# Patient Record
Sex: Female | Born: 1995 | Race: Black or African American | Hispanic: No | Marital: Single | Smoking: Former smoker
Health system: Southern US, Community
[De-identification: ages and names within clinical notes are randomized; demographics above are authoritative.]

## PROBLEM LIST (undated history)

## (undated) DIAGNOSIS — F1910 Other psychoactive substance abuse, uncomplicated: Secondary | ICD-10-CM

---
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
## 2016-07-08 ENCOUNTER — Emergency Department (HOSPITAL_COMMUNITY)
Admission: EM | Admit: 2016-07-08 | Discharge: 2016-07-08 | Disposition: A | Discharge status: Home / Self Care | Payer: Medicaid Other | Attending: Emergency Medicine | Admitting: Emergency Medicine

## 2016-07-08 ENCOUNTER — Encounter (HOSPITAL_COMMUNITY): Payer: Self-pay

## 2016-07-08 DIAGNOSIS — J069 Acute upper respiratory infection, unspecified: Secondary | ICD-10-CM

## 2016-07-08 DIAGNOSIS — Z87891 Personal history of nicotine dependence: Secondary | ICD-10-CM | POA: Insufficient documentation

## 2016-07-08 MED ORDER — LORATADINE 10 MG PO TABS: 10.0000 mg | ORAL_TABLET | Freq: Every day | ORAL | 0 refills | Status: DC

## 2016-07-08 MED ORDER — BENZONATATE 100 MG PO CAPS: 100.0000 mg | ORAL_CAPSULE | Freq: Three times a day (TID) | ORAL | 0 refills | Status: DC

## 2016-07-08 NOTE — ED Notes (Signed)
Pt c/o cold symptoms and sore throat.  Has not taken any OTC meds.  Pt st's needs a doctors note for work today.

## 2016-07-08 NOTE — ED Triage Notes (Signed)
Onset several days sore throat, yesterday runny nose, headache, today pt woke up sore throat worse and non productive cough.  No shortness of breath, N/V/D.

## 2016-07-08 NOTE — ED Provider Notes (Signed)
[facility]-EMERGENCY DEPT Provider Note   CSN: [id number] Arrival date & time: 07/08/16  1453  By signing my name below, I, Nelwyn SalisburyJoshua Fowler, attest that this documentation has been prepared under the direction and in the presence of non-physician practitioner, Kerrie BuffaloHope Kiauna Zywicki, NP. Electronically Signed: Nelwyn SalisburyJoshua Fowler, Scribe. 07/08/2016. 4:06 PM.  History   Chief Complaint Chief Complaint  Patient presents with  . Sore Throat  . Cough   The history is provided by the patient. No language interpreter was used.  Sore Throat  This is a new problem. The current episode started more than 2 days ago. The problem occurs constantly. The problem has not changed since onset.Associated symptoms include headaches. Pertinent negatives include no abdominal pain and no shortness of breath. The symptoms are aggravated by swallowing. Nothing relieves the symptoms. She has tried nothing for the symptoms. The treatment provided no relief.    HPI Comments:  Erica Bullock is an otherwise healthy 21 y.o. female who presents to the Emergency Department complaining of constant, mild, scratchy throat onset 4 days. Her pain is worsened with swallowing. She reports associated non-productive, cough, nasal congestion, rhinorrhea, headache, sneezing, sinus pressure and itchy eyes . Pt has taken anything PTA for her symptoms. Denies any ear ache, nausea, vomiting, abdominal pain, diarrhea, wheezing, constipation or SOB. P/o intake intact.   History reviewed. No pertinent past medical history.  There are no active problems to display for this patient.   History reviewed. No pertinent surgical history.  OB History    No data available       Home Medications    Prior to Admission medications   Medication Sig Start Date End Date Taking? Authorizing Provider  benzonatate (TESSALON) 100 MG capsule Take 1 capsule (100 mg total) by mouth every 8 (eight) hours. 07/08/16   Janne NapoleonNeese, Keitra Carusone M, NP  fluticasone (FLONASE) 50 MCG/ACT nasal  spray Place 2 sprays into the nose daily. 07/17/11 09/01/12  Truddie CocoBush, Tamika, DO  loratadine (CLARITIN) 10 MG tablet Take 1 tablet (10 mg total) by mouth daily. 07/08/16   Janne NapoleonNeese, Jenasis Straley M, NP    Family History History reviewed. No pertinent family history.  Social History Social History  Substance Use Topics  . Smoking status: Former Games developermoker  . Smokeless tobacco: Never Used  . Alcohol use No     Allergies   Other   Review of Systems Review of Systems  Constitutional: Negative for chills and fever.  HENT: Positive for congestion, rhinorrhea, sinus pressure and sneezing. Negative for ear pain, trouble swallowing and voice change. Sore throat: scratchy.   Eyes: Positive for itching. Negative for pain.  Respiratory: Positive for cough. Negative for shortness of breath and wheezing.   Gastrointestinal: Negative for abdominal pain, constipation, diarrhea, nausea and vomiting.  Musculoskeletal: Negative for neck pain.  Skin: Negative for rash.  Neurological: Positive for headaches.  Hematological: Negative for adenopathy.  Psychiatric/Behavioral: The patient is not nervous/anxious.      Physical Exam Updated Vital Signs BP 107/67 (BP Location: Left Arm)   Pulse 89   Temp 99.1 F (37.3 C) (Oral)   Resp 16   Ht 5' (1.524 m)   Wt 56 kg   LMP 06/17/2016   SpO2 100%   BMI 24.10 kg/m   Physical Exam  Constitutional: She is oriented to person, place, and time. She appears well-developed and well-nourished. No distress.  HENT:  Head: Normocephalic and atraumatic.  Right Ear: Tympanic membrane normal.  Left Ear: Tympanic membrane normal.  Nose: Mucosal edema  and rhinorrhea present.  Mouth/Throat: Uvula is midline, oropharynx is clear and moist and mucous membranes are normal. Normal dentition. No posterior oropharyngeal edema or posterior oropharyngeal erythema.  Nasal mucosa swollen.   Eyes: Conjunctivae and EOM are normal. Pupils are equal, round, and reactive to light. Right eye  exhibits no discharge. Left eye exhibits no discharge. No scleral icterus.  Neck: Neck supple.  Cardiovascular: Normal rate and regular rhythm.   Pulmonary/Chest: Effort normal and breath sounds normal.  Abdominal: Soft. Bowel sounds are normal. She exhibits no distension. There is no tenderness.  Musculoskeletal: Normal range of motion.  Lymphadenopathy:    She has no cervical adenopathy.  Neurological: She is alert and oriented to person, place, and time.  Skin: Skin is warm and dry.  Psychiatric: She has a normal mood and affect. Her behavior is normal.  Nursing note and vitals reviewed.    ED Treatments / Results  DIAGNOSTIC STUDIES:  Oxygen Saturation is 100% on RA, normal by my interpretation.    COORDINATION OF CARE:  4:18 PM Discussed treatment plan with pt at bedside which includes symptomatic management and pt agreed to plan.  Labs (all labs ordered are listed, but only abnormal results are displayed) Labs Reviewed - No data to display  EKG  EKG Interpretation None       Radiology No results found.  Procedures Procedures (including critical care time)  Medications Ordered in ED Medications - No data to display   Initial Impression / Assessment and Plan / ED Course  I have reviewed the triage vital signs and the nursing notes. Pt symptoms consistent with URI. Pt will be discharged with symptomatic treatment.  Discussed return precautions.  Pt is hemodynamically stable & in NAD prior to discharge.  Final Clinical Impressions(s) / ED Diagnoses   Final diagnoses:  Viral upper respiratory tract infection    New Prescriptions New Prescriptions   BENZONATATE (TESSALON) 100 MG CAPSULE    Take 1 capsule (100 mg total) by mouth every 8 (eight) hours.   LORATADINE (CLARITIN) 10 MG TABLET    Take 1 tablet (10 mg total) by mouth daily.   I personally performed the services described in this documentation, which was scribed in my presence. The recorded  information has been reviewed and is accurate.     Kerrie Buffalo [place], [place] 07/08/16 1631    Mancel Bale, MD 07/11/16 [phone]

## 2017-02-28 ENCOUNTER — Emergency Department (HOSPITAL_COMMUNITY)
Admission: EM | Admit: 2017-02-28 | Discharge: 2017-02-28 | Disposition: A | Discharge status: Home / Self Care | Payer: Self-pay | Attending: Emergency Medicine | Admitting: Emergency Medicine

## 2017-02-28 ENCOUNTER — Encounter (HOSPITAL_COMMUNITY): Payer: Self-pay

## 2017-02-28 DIAGNOSIS — Z79899 Other long term (current) drug therapy: Secondary | ICD-10-CM | POA: Insufficient documentation

## 2017-02-28 DIAGNOSIS — Z87891 Personal history of nicotine dependence: Secondary | ICD-10-CM | POA: Insufficient documentation

## 2017-02-28 DIAGNOSIS — R21 Rash and other nonspecific skin eruption: Secondary | ICD-10-CM | POA: Insufficient documentation

## 2017-02-28 DIAGNOSIS — W57XXXA Bitten or stung by nonvenomous insect and other nonvenomous arthropods, initial encounter: Secondary | ICD-10-CM | POA: Insufficient documentation

## 2017-02-28 MED ORDER — CETIRIZINE HCL 10 MG PO TABS: 10.0000 mg | ORAL_TABLET | Freq: Every day | ORAL | 0 refills | Status: DC

## 2017-02-28 MED ORDER — TRIAMCINOLONE ACETONIDE 0.1 % EX CREA: 1.0000 "application " | TOPICAL_CREAM | Freq: Two times a day (BID) | CUTANEOUS | 0 refills | Status: DC

## 2017-02-28 NOTE — ED Provider Notes (Signed)
[facility] EMERGENCY DEPARTMENT Provider Note   CSN: [id number] Arrival date & time: 02/28/17  1652     History   Chief Complaint No chief complaint on file.   HPI Erica CobbOceanna Bullock is a 21 y.o. female presenting with bug bites.  Patient states she was sleeping at her cousin's house on Friday when she noticed a bite on her right hand.  The next day, she changed from the couch to her cousin's bed, and woke up with multiple bites along her back.  The bites are itchy, worse with scratching and heat.  She states it feels different than when she has had flea bites.  She denies fevers or chills.  Bites are on her torso and arms, no bites on her legs or head.  She denies new detergents, soaps, clothes, foods. She has not taken any medications for the itching.  She has been using alcohol and soap and water for symptom relief. She denies other medical problems, does not take medications daily.   HPI  History reviewed. No pertinent past medical history.  There are no active problems to display for this patient.   History reviewed. No pertinent surgical history.  OB History    No data available       Home Medications    Prior to Admission medications   Medication Sig Start Date End Date Taking? Authorizing Provider  benzonatate (TESSALON) 100 MG capsule Take 1 capsule (100 mg total) by mouth every 8 (eight) hours. 07/08/16   Janne NapoleonNeese, Hope M, NP  cetirizine (ZYRTEC) 10 MG tablet Take 1 tablet (10 mg total) by mouth daily. 02/28/17   Katrenia Alkins, PA-C  fluticasone (FLONASE) 50 MCG/ACT nasal spray Place 2 sprays into the nose daily. 07/17/11 09/01/12  Truddie CocoBush, Tamika, DO  loratadine (CLARITIN) 10 MG tablet Take 1 tablet (10 mg total) by mouth daily. 07/08/16   Janne NapoleonNeese, Hope M, NP  triamcinolone cream (KENALOG) 0.1 % Apply 1 application topically 2 (two) times daily. 02/28/17   Santanna Whitford, PA-C    Family History History reviewed. No pertinent family history.  Social  History Social History   Tobacco Use  . Smoking status: Former Games developermoker  . Smokeless tobacco: Never Used  Substance Use Topics  . Alcohol use: No  . Drug use: No     Allergies   Other   Review of Systems Review of Systems  Constitutional: Negative for chills and fever.  Skin: Positive for rash (bug bites).     Physical Exam Updated Vital Signs BP 105/64 (BP Location: Right Arm)   Pulse 74   Temp 99.1 F (37.3 C) (Oral)   Resp 16   Ht 5' (1.524 m)   Wt 63.5 kg (140 lb)   LMP 02/12/2017 (Exact Date)   SpO2 100%   BMI 27.34 kg/m   Physical Exam  Constitutional: She is oriented to person, place, and time. She appears well-developed and well-nourished. No distress.  HENT:  Head: Normocephalic and atraumatic.  Eyes: EOM are normal.  Neck: Normal range of motion.  Pulmonary/Chest: Effort normal.  Abdominal: She exhibits no distension.  Musculoskeletal: Normal range of motion.  Neurological: She is alert and oriented to person, place, and time.  Skin: Skin is warm.  Multiple discrete bites noted along bilateral posterior arms, abdomen, and back.  Bites are often linear.  Minimal excoriation.  No surrounding erythema or signs of cellulitis.  Psychiatric: She has a normal mood and affect.  Nursing note and vitals reviewed.  ED Treatments / Results  Labs (all labs ordered are listed, but only abnormal results are displayed) Labs Reviewed - No data to display  EKG  EKG Interpretation None       Radiology No results found.  Procedures Procedures (including critical care time)  Medications Ordered in ED Medications - No data to display   Initial Impression / Assessment and Plan / ED Course  I have reviewed the triage vital signs and the nursing notes.  Pertinent labs & imaging results that were available during my care of the patient were reviewed by me and considered in my medical decision making (see chart for details).     Pt presenting for  evaluation of bug bites.  Physical exam shows multiple discrete bites without significant excoriation or signs of cellulitis.  Likely bedbug bites or other insect bites.  Discussed findings with patient.  Discussed symptomatic treatment with triamcinolone and Zyrtec.  Patient to wash her close and check for bedbugs.  Patient states she has had a bedbug infestation before, and will go through the same step she did last time.  Patient to follow-up with primary care if symptoms persist.  At this time, patient appears safe for discharge.  Return precautions given.  Patient states she understands and agrees to plan.   Final Clinical Impressions(s) / ED Diagnoses   Final diagnoses:  Bug bite, initial encounter    ED Discharge Orders        Ordered    triamcinolone cream (KENALOG) 0.1 %  2 times daily     02/28/17 1921    cetirizine (ZYRTEC) 10 MG tablet  Daily     02/28/17 1921       Alveria ApleyCaccavale, Pookela Sellin, PA-C 03/01/17 0039    Jacalyn LefevreHaviland, Julie, MD 03/01/17 1515

## 2017-02-28 NOTE — ED Triage Notes (Signed)
Pt presents with red raised areas x 7 days.  Pt was at family's house, dogs had fleas;  Area began to R hand and is now generalized.  Area to hand began as pain with swelling but now is resolved.  Other areas are red and itch.

## 2017-02-28 NOTE — Discharge Instructions (Signed)
Use the triamcinolone cream as needed for itching.  Do not apply to the face. Take cetirizine daily to help with itching. Try your best not to itch the bites, as this will cause worsening symptoms.  Heat or hot water may also make it worse. Follow-up with your primary care doctor if your symptoms are not improving. Return to the emergency room if you develop fevers, drainage from the bites, or any new or concerning symptoms.

## 2017-12-02 ENCOUNTER — Ambulatory Visit (HOSPITAL_COMMUNITY)
Admission: EM | Admit: 2017-12-02 | Discharge: 2017-12-02 | Disposition: A | Discharge status: Home / Self Care | Payer: Medicaid Other | Attending: Family Medicine | Admitting: Family Medicine

## 2017-12-02 ENCOUNTER — Encounter (HOSPITAL_COMMUNITY): Payer: Self-pay | Admitting: Emergency Medicine

## 2017-12-02 DIAGNOSIS — K219 Gastro-esophageal reflux disease without esophagitis: Secondary | ICD-10-CM | POA: Insufficient documentation

## 2017-12-02 DIAGNOSIS — Z87891 Personal history of nicotine dependence: Secondary | ICD-10-CM | POA: Insufficient documentation

## 2017-12-02 DIAGNOSIS — Z3202 Encounter for pregnancy test, result negative: Secondary | ICD-10-CM

## 2017-12-02 DIAGNOSIS — R1013 Epigastric pain: Secondary | ICD-10-CM

## 2017-12-02 LAB — CBC
HEMATOCRIT: 42.2 % (ref 36.0–46.0)
HEMOGLOBIN: 13.7 g/dL (ref 12.0–15.0)
MCH: 29.2 pg (ref 26.0–34.0)
MCHC: 32.5 g/dL (ref 30.0–36.0)
MCV: 90 fL (ref 78.0–100.0)
PLATELETS: 284 10*3/uL (ref 150–400)
RBC: 4.69 MIL/uL (ref 3.87–5.11)
RDW: 12.6 % (ref 11.5–15.5)
WBC: 6.2 10*3/uL (ref 4.0–10.5)

## 2017-12-02 LAB — COMPREHENSIVE METABOLIC PANEL
ALK PHOS: 50 U/L (ref 38–126)
ALT: 15 U/L (ref 0–44)
AST: 30 U/L (ref 15–41)
Albumin: 4.5 g/dL (ref 3.5–5.0)
Anion gap: 12 (ref 5–15)
BILIRUBIN TOTAL: 0.9 mg/dL (ref 0.3–1.2)
BUN: 12 mg/dL (ref 6–20)
CALCIUM: 9.3 mg/dL (ref 8.9–10.3)
CO2: 25 mmol/L (ref 22–32)
Chloride: 99 mmol/L (ref 98–111)
Creatinine, Ser: 0.82 mg/dL (ref 0.44–1.00)
Glucose, Bld: 104 mg/dL — ABNORMAL HIGH (ref 70–99)
Potassium: 4.5 mmol/L (ref 3.5–5.1)
Sodium: 136 mmol/L (ref 135–145)
TOTAL PROTEIN: 7.3 g/dL (ref 6.5–8.1)

## 2017-12-02 LAB — LIPASE, BLOOD: LIPASE: 28 U/L (ref 11–51)

## 2017-12-02 LAB — AMYLASE: AMYLASE: 83 U/L (ref 28–100)

## 2017-12-02 LAB — POCT PREGNANCY, URINE: Preg Test, Ur: NEGATIVE

## 2017-12-02 MED ORDER — SUCRALFATE 1 G PO TABS: 1.0000 g | ORAL_TABLET | Freq: Three times a day (TID) | ORAL | 0 refills | Status: DC

## 2017-12-02 MED ORDER — RANITIDINE HCL 150 MG PO CAPS: 150.0000 mg | ORAL_CAPSULE | Freq: Two times a day (BID) | ORAL | 0 refills | Status: DC

## 2017-12-02 NOTE — ED Provider Notes (Signed)
[facility] CARE CENTER   [id number] 12/02/17 Arrival Time: 4098  ASSESSMENT & PLAN:  1. Epigastric pain     Meds ordered this encounter  Medications  . ranitidine (ZANTAC) 150 MG capsule    Sig: Take 1 capsule (150 mg total) by mouth 2 (two) times daily.    Dispense:  30 capsule    Refill:  0  . sucralfate (CARAFATE) 1 g tablet    Sig: Take 1 tablet (1 g total) by mouth 4 (four) times daily -  with meals and at bedtime.    Dispense:  28 tablet    Refill:  0     Discharge Instructions     You have been seen today for abdominal pain. Your evaluation was not suggestive of any emergent condition requiring medical intervention at this time. However, some abdominal problems make take more time to appear. Therefore, it is important for you to watch for any new symptoms or worsening of your current condition. Please return here or to the Emergency Department immediately should you feel worse in any way or have any of the following symptoms: increasing or different abdominal pain, persistent vomiting, fevers, or shaking chills.   Follow-up Information    Leilani Able, MD.   Specialty:  Family Medicine Why:  If your abdominal pain is not getting better with the medicines prescribed. Contact information: [address] [place] [address] [phone]        [facility] EMERGENCY DEPARTMENT.   Specialty:  Emergency Medicine Why:  If symptoms worsen. Contact information: [address] [id number] mc [place] [address] [phone]          Reviewed expectations re: course of current medical issues. Questions answered. Outlined signs and symptoms indicating need for more acute intervention. Patient verbalized understanding. After Visit Summary given.   SUBJECTIVE:  Brunilda Eble is a 22 y.o. female who presents with complaint of intermittent abdominal discomfort. Onset gradual, over the past month. Location: epigastric  without radiation. Described as deep and aching with sharp exacerbations. Experiences this about 3-4 times per week with each episode lasting around 30 minutes with gradual resolutions. Symptoms are unchanged since beginning. Aggravating factors: none. Alleviating factors: none. Associated symptoms: belching. She denies fever, myalgias and vomiting. Occasional mild nausea. Appetite: normal. PO intake: normal. Ambulatory without assistance. Urinary symptoms: none. No vaginal discharge. Last bowel movement yesterday without blood. No heavy alcohol or drug use except for occasional smoking of marijuana. Occasional cigarette. OTC treatment: none reported.  Patient's last menstrual period was 11/28/2017.   History reviewed. No pertinent surgical history.  ROS: As per HPI. All other systems negative.  OBJECTIVE:  Vitals:   12/02/17 1044  BP: 96/82  Pulse: 80  Resp: 16  Temp: 98.6 F (37 C)  SpO2: 100%    General appearance: alert; no distress Lungs: clear to auscultation bilaterally Heart: regular rate and rhythm Abdomen: soft; non-distended; mild epigastric tenderness; bowel sounds present; no masses or organomegaly; no guarding or rebound tenderness Back: no CVA tenderness; FROM at hips Extremities: no edema; symmetrical with no gross deformities Skin: warm and dry Neurologic: normal gait Psychological: alert and cooperative; normal mood and affect  Labs: Pending:  Labs Reviewed  CBC  COMPREHENSIVE METABOLIC PANEL  AMYLASE  LIPASE, BLOOD  POCT PREGNANCY, URINE  UPT negative.  Allergies  Allergen Reactions  . Other     Horse serum    PMH: Occasional acid reflux.  Social History   Socioeconomic History  . Marital status: Single    Spouse name: Not on file  . Number of children: Not on file  . Years of education: Not on file  . Highest education level: Not on file  Occupational History  . Not on file  Social Needs  . Financial  resource strain: Not on file  . Food insecurity:    Worry: Not on file    Inability: Not on file  . Transportation needs:    Medical: Not on file    Non-medical: Not on file  Tobacco Use  . Smoking status: Former Games developer  . Smokeless tobacco: Never Used  Substance and Sexual Activity  . Alcohol use: No  . Drug use: No  . Sexual activity: Not on file  Lifestyle  . Physical activity:    Days per week: Not on file    Minutes per session: Not on file  . Stress: Not on file  Relationships  . Social connections:    Talks on phone: Not on file    Gets together: Not on file    Attends religious service: Not on file    Active member of club or organization: Not on file    Attends meetings of clubs or organizations: Not on file    Relationship status: Not on file  . Intimate partner violence:    Fear of current or ex partner: Not on file    Emotionally abused: Not on file    Physically abused: Not on file    Forced sexual activity: Not on file  Other Topics Concern  . Not on file  Social History Narrative  . Not on file   FH: No h/o colon disease.   Mardella Layman, MD 12/02/17 1213

## 2017-12-02 NOTE — ED Triage Notes (Signed)
Pt c/o upper middle abdominal pain x1 month, comes and goes, off and on. Pt states shes been trying gas pills because normally its gas but no relief.

## 2017-12-02 NOTE — Discharge Instructions (Addendum)
You have been seen today for abdominal pain. Your evaluation was not suggestive of any emergent condition requiring medical intervention at this time. However, some abdominal problems make take more time to appear. Therefore, it is important for you to watch for any new symptoms or worsening of your current condition. Please return here or to the Emergency Department immediately should you feel worse in any way or have any of the following symptoms: increasing or different abdominal pain, persistent vomiting, fevers, or shaking chills.  ° ° °

## 2019-01-06 ENCOUNTER — Ambulatory Visit (HOSPITAL_COMMUNITY)
Admission: EM | Admit: 2019-01-06 | Discharge: 2019-01-06 | Disposition: A | Discharge status: Home / Self Care | Payer: PRIVATE HEALTH INSURANCE | Attending: Internal Medicine | Admitting: Internal Medicine

## 2019-01-06 ENCOUNTER — Other Ambulatory Visit: Payer: Self-pay

## 2019-01-06 ENCOUNTER — Encounter (HOSPITAL_COMMUNITY): Payer: Self-pay | Admitting: Emergency Medicine

## 2019-01-06 DIAGNOSIS — N644 Mastodynia: Secondary | ICD-10-CM | POA: Diagnosis not present

## 2019-01-06 DIAGNOSIS — R0989 Other specified symptoms and signs involving the circulatory and respiratory systems: Secondary | ICD-10-CM | POA: Diagnosis present

## 2019-01-06 DIAGNOSIS — N6452 Nipple discharge: Secondary | ICD-10-CM | POA: Insufficient documentation

## 2019-01-06 DIAGNOSIS — J3089 Other allergic rhinitis: Secondary | ICD-10-CM | POA: Diagnosis not present

## 2019-01-06 DIAGNOSIS — F17210 Nicotine dependence, cigarettes, uncomplicated: Secondary | ICD-10-CM | POA: Diagnosis not present

## 2019-01-06 DIAGNOSIS — Z20828 Contact with and (suspected) exposure to other viral communicable diseases: Secondary | ICD-10-CM | POA: Insufficient documentation

## 2019-01-06 DIAGNOSIS — G44209 Tension-type headache, unspecified, not intractable: Secondary | ICD-10-CM | POA: Insufficient documentation

## 2019-01-06 MED ORDER — NAPROXEN 500 MG PO TABS: 500.0000 mg | ORAL_TABLET | Freq: Two times a day (BID) | ORAL | 0 refills | Status: DC

## 2019-01-06 MED ORDER — CETIRIZINE HCL 10 MG PO TABS: 10.0000 mg | ORAL_TABLET | Freq: Every day | ORAL | 0 refills | Status: DC

## 2019-01-06 MED ORDER — PSEUDOEPHEDRINE HCL 60 MG PO TABS: 60.0000 mg | ORAL_TABLET | Freq: Three times a day (TID) | ORAL | 0 refills | Status: DC | PRN

## 2019-01-06 MED ORDER — FLUTICASONE PROPIONATE 50 MCG/ACT NA SUSP: 2.0000 | Freq: Every day | NASAL | 0 refills | Status: DC

## 2019-01-06 NOTE — Discharge Instructions (Addendum)
Please make sure you start the allergy medications Zyrtec, Flonase.  The generic names for these are cetirizine and fluticasone.  You can pick these up over-the-counter if your insurance does not cover them.  I also want you to use pseudoephedrine at 60 mg 3 times a day as needed for any kind of postnasal drainage, sinus congestion.  This is also an over-the-counter medication that I prescribed you but you have to make sure that you specifically requests this at the pharmacy.  Ou may use naproxen for your general headaches and breast pain.  Have provided you with an order to the breast center so that you can have imaging done to further investigate your nipple discharge.

## 2019-01-06 NOTE — ED Provider Notes (Signed)
MRN: [id number] DOB: 1995-06-23  Subjective:   Erica Bullock is a 23 y.o. female presenting for 1 week history of persistent mild-severe waxing and waning frontal-temporal headache.  Has tried some ibuprofen intermittently with temporary relief.  Has also had left breast pain. No skin changes of her breast, spontaneous drainage of pus or bleeding. However, admits that she has been able to express her right breast over the past 2 years.  Does not have a PCP, did set up office visit but wanted to have a consult regarding her headache.  No known COVID-19 contacts.  Tries to hydrate well.  She gets about 8 hours of sleep nightly.  She does smoke cigarettes about a 1/4-1/2ppd but has been trying to quit.  Denies taking any medications currently.  Allergies  Allergen Reactions  . Other     Horse serum     History reviewed. No pertinent past medical history.   History reviewed. No pertinent surgical history.  Review of Systems  Constitutional: Negative for fever and malaise/fatigue.  HENT: Negative for congestion, ear pain, sinus pain and sore throat.        Intermittent runny nose.   Eyes: Negative for discharge and redness.  Respiratory: Positive for cough (dry, was happening before her headache symptoms, comes and goes). Negative for hemoptysis, shortness of breath and wheezing.   Cardiovascular: Negative for chest pain.  Gastrointestinal: Negative for abdominal pain, blood in stool, constipation, diarrhea, nausea and vomiting.  Genitourinary: Negative for dysuria, flank pain and hematuria.  Musculoskeletal: Negative for myalgias.  Skin: Negative for rash.  Neurological: Positive for headaches. Negative for dizziness and weakness.  Psychiatric/Behavioral: Negative for depression and substance abuse.    Objective:   Vitals: BP 119/77 (BP Location: Right Arm)   Pulse 84   Temp 98.3 F (36.8 C) (Oral)   Resp 15   LMP 12/25/2018   SpO2 100%   Physical Exam Constitutional:    General: She is not in acute distress.    Appearance: Normal appearance. She is well-developed. She is not ill-appearing, toxic-appearing or diaphoretic.  HENT:     Head: Normocephalic and atraumatic.     Right Ear: Tympanic membrane and ear canal normal. No drainage or tenderness. No middle ear effusion. Tympanic membrane is not erythematous.     Left Ear: Tympanic membrane and ear canal normal. No drainage or tenderness.  No middle ear effusion. Tympanic membrane is not erythematous.     Nose: Rhinorrhea present. No congestion.     Comments: Nasal turbinates erythematous.    Mouth/Throat:     Mouth: Mucous membranes are moist. No oral lesions.     Pharynx: No pharyngeal swelling, oropharyngeal exudate, posterior oropharyngeal erythema or uvula swelling.     Tonsils: No tonsillar exudate or tonsillar abscesses.     Comments: Thick streaks of postnasal drainage. Eyes:     General: No scleral icterus.    Extraocular Movements: Extraocular movements intact.     Right eye: Normal extraocular motion.     Left eye: Normal extraocular motion.     Conjunctiva/sclera: Conjunctivae normal.     Pupils: Pupils are equal, round, and reactive to light.  Neck:     Musculoskeletal: Normal range of motion and neck supple.  Cardiovascular:     Rate and Rhythm: Normal rate and regular rhythm.     Pulses: Normal pulses.     Heart sounds: Normal heart sounds. No murmur. No friction rub. No gallop.   Pulmonary:     Effort:  Pulmonary effort is normal. No respiratory distress.     Breath sounds: Normal breath sounds. No stridor. No wheezing, rhonchi or rales.  Lymphadenopathy:     Cervical: No cervical adenopathy.  Skin:    General: Skin is warm and dry.     Findings: No rash.  Neurological:     General: No focal deficit present.     Mental Status: She is alert and oriented to person, place, and time.     Cranial Nerves: No cranial nerve deficit.     Motor: No weakness.     Coordination: Coordination  normal.     Gait: Gait normal.     Deep Tendon Reflexes: Reflexes normal.  Psychiatric:        Mood and Affect: Mood normal.        Behavior: Behavior normal.        Thought Content: Thought content normal.        Judgment: Judgment normal.    Assessment and Plan :   1. Tension headache   2. Allergic rhinitis due to other allergic trigger, unspecified seasonality   3. Runny nose   4. Breast pain, left   5. Nipple discharge     Suspect tension type headache.  Will use naproxen for symptomatic relief and start her on allergy regimen given her ENT findings.  We will have patient start imaging at the breast center, order given to patient.  Emphasized need to maintain her appointment to establish care with her PCP for follow-up. Counseled patient on potential for adverse effects with medications prescribed/recommended today, ER and return-to-clinic precautions discussed, patient verbalized understanding.    Wallis Bamberg, [place] 01/06/19 1636

## 2019-01-06 NOTE — ED Triage Notes (Signed)
Headache since last week.  Left epigastric pain.  Denies nausea, no vomiting. No fever, no cough

## 2019-01-07 LAB — NOVEL CORONAVIRUS, NAA (HOSP ORDER, SEND-OUT TO REF LAB; TAT 18-24 HRS): SARS-CoV-2, NAA: NOT DETECTED

## 2019-02-04 ENCOUNTER — Telehealth: Payer: Self-pay | Admitting: *Deleted

## 2019-02-04 NOTE — Telephone Encounter (Signed)
Patient called for results , also signed up for my chart .

## 2019-05-31 ENCOUNTER — Encounter (HOSPITAL_COMMUNITY): Payer: Self-pay

## 2019-05-31 ENCOUNTER — Emergency Department (HOSPITAL_COMMUNITY)
Admission: EM | Admit: 2019-05-31 | Discharge: 2019-05-31 | Disposition: A | Discharge status: Home / Self Care | Payer: PRIVATE HEALTH INSURANCE | Attending: Emergency Medicine | Admitting: Emergency Medicine

## 2019-05-31 DIAGNOSIS — R1031 Right lower quadrant pain: Secondary | ICD-10-CM | POA: Insufficient documentation

## 2019-05-31 DIAGNOSIS — Z5321 Procedure and treatment not carried out due to patient leaving prior to being seen by health care provider: Secondary | ICD-10-CM | POA: Insufficient documentation

## 2019-05-31 DIAGNOSIS — R197 Diarrhea, unspecified: Secondary | ICD-10-CM | POA: Insufficient documentation

## 2019-05-31 LAB — COMPREHENSIVE METABOLIC PANEL
ALT: 12 U/L (ref 0–44)
AST: 21 U/L (ref 15–41)
Albumin: 4.3 g/dL (ref 3.5–5.0)
Alkaline Phosphatase: 59 U/L (ref 38–126)
Anion gap: 9 (ref 5–15)
BUN: 10 mg/dL (ref 6–20)
CO2: 25 mmol/L (ref 22–32)
Calcium: 9.7 mg/dL (ref 8.9–10.3)
Chloride: 103 mmol/L (ref 98–111)
Creatinine, Ser: 0.87 mg/dL (ref 0.44–1.00)
GFR calc Af Amer: >60 mL/min (ref >60)
GFR calc non Af Amer: >60 mL/min (ref >60)
Glucose, Bld: 101 mg/dL — ABNORMAL HIGH (ref 70–99)
Potassium: 3.6 mmol/L (ref 3.5–5.1)
Sodium: 137 mmol/L (ref 135–145)
Total Bilirubin: 0.5 mg/dL (ref 0.3–1.2)
Total Protein: 7 g/dL (ref 6.5–8.1)

## 2019-05-31 LAB — URINALYSIS, ROUTINE W REFLEX MICROSCOPIC
Bilirubin Urine: NEGATIVE
Glucose, UA: NEGATIVE mg/dL
Ketones, ur: NEGATIVE mg/dL
Nitrite: NEGATIVE
Protein, ur: 30 mg/dL — AB
Specific Gravity, Urine: 1.004 — ABNORMAL LOW (ref 1.005–1.030)
WBC, UA: >50 WBC/hpf — ABNORMAL HIGH (ref 0–5)
pH: 6 (ref 5.0–8.0)

## 2019-05-31 LAB — I-STAT BETA HCG BLOOD, ED (MC, WL, AP ONLY): I-stat hCG, quantitative: <5 m[IU]/mL (ref <5)

## 2019-05-31 LAB — CBC
HCT: 38.7 % (ref 36.0–46.0)
Hemoglobin: 12.6 g/dL (ref 12.0–15.0)
MCH: 28.3 pg (ref 26.0–34.0)
MCHC: 32.6 g/dL (ref 30.0–36.0)
MCV: 87 fL (ref 80.0–100.0)
Platelets: 289 10*3/uL (ref 150–400)
RBC: 4.45 MIL/uL (ref 3.87–5.11)
RDW: 12.9 % (ref 11.5–15.5)
WBC: 9.8 10*3/uL (ref 4.0–10.5)
nRBC: 0 % (ref 0.0–0.2)

## 2019-05-31 LAB — LIPASE, BLOOD: Lipase: 20 U/L (ref 11–51)

## 2019-05-31 MED ORDER — SODIUM CHLORIDE 0.9% FLUSH: 3.0000 mL | Freq: Once | INTRAVENOUS | Status: DC

## 2019-05-31 NOTE — ED Notes (Signed)
Pt states she is going to leave, pt aware that she is able to come back at a later time if needed.

## 2019-05-31 NOTE — ED Triage Notes (Signed)
Pt arrives to the ED w/ c/o intermittent 7/10 RLQ abdominal pain x 1 day. Pt denies n/v, endorses diarrhea. Pt states she is concerned she has a UTI.

## 2020-04-10 ENCOUNTER — Ambulatory Visit (HOSPITAL_COMMUNITY)
Admission: EM | Admit: 2020-04-10 | Discharge: 2020-04-10 | Disposition: A | Discharge status: Home / Self Care | Payer: Medicaid Other | Attending: Emergency Medicine | Admitting: Emergency Medicine

## 2020-04-10 ENCOUNTER — Encounter (HOSPITAL_COMMUNITY): Payer: Self-pay | Admitting: Emergency Medicine

## 2020-04-10 ENCOUNTER — Other Ambulatory Visit: Payer: Self-pay

## 2020-04-10 DIAGNOSIS — K219 Gastro-esophageal reflux disease without esophagitis: Secondary | ICD-10-CM

## 2020-04-10 MED ORDER — OMEPRAZOLE 20 MG PO CPDR: 20.0000 mg | DELAYED_RELEASE_CAPSULE | Freq: Every day | ORAL | 0 refills | Status: DC

## 2020-04-10 MED ORDER — FAMOTIDINE 20 MG PO TABS: 20.0000 mg | ORAL_TABLET | Freq: Every day | ORAL | 0 refills | Status: DC

## 2020-04-10 NOTE — ED Triage Notes (Signed)
Pt states that she is having upper abdominal pain that started a few months ago but recently the pain has gotten worse.

## 2020-04-10 NOTE — Discharge Instructions (Signed)
Omeprazole daily. Pepcid at night as needed.  See food choices which should help your symptoms.  Avoid alcohol, ibuprofen/aleve/ aspirin, and smoking, as able to prevent stomach ulcers.

## 2020-04-10 NOTE — ED Provider Notes (Signed)
[facility]    CSN: [id number] Arrival date & time: 04/10/20  1353      History   Chief Complaint Chief Complaint  Patient presents with  . Abdominal Pain    HPI Erica Bullock is a 25 y.o. female.   Erica Bullock presents with complaints of epigastric burning, which wakes her at night, and is worse after eating some foods, particularly spicy foods. Taking generic tums does help. No current pain as she took tums PTA. Has had issues like this for months now. No nausea or vomiting. She does smoke. No blood or black to stool.     ROS per HPI, negative if not otherwise mentioned.      History reviewed. No pertinent past medical history.  There are no problems to display for this patient.   History reviewed. No pertinent surgical history.  OB History   No obstetric history on file.      Home Medications    Prior to Admission medications   Medication Sig Start Date End Date Taking? Authorizing Provider  famotidine (PEPCID) 20 MG tablet Take 1 tablet (20 mg total) by mouth at bedtime. 04/10/20  Yes Linus Mako B, NP  omeprazole (PRILOSEC) 20 MG capsule Take 1 capsule (20 mg total) by mouth daily. 04/10/20  Yes Linus Mako B, NP  cetirizine (ZYRTEC) 10 MG tablet Take 1 tablet (10 mg total) by mouth daily. 01/06/19   Wallis Bamberg, PA-C  fluticasone (FLONASE) 50 MCG/ACT nasal spray Place 2 sprays into both nostrils daily. 01/06/19   Wallis Bamberg, PA-C  naproxen (NAPROSYN) 500 MG tablet Take 1 tablet (500 mg total) by mouth 2 (two) times daily. 01/06/19   Wallis Bamberg, PA-C  pseudoephedrine (SUDAFED) 60 MG tablet Take 1 tablet (60 mg total) by mouth every 8 (eight) hours as needed for congestion. 01/06/19   Wallis Bamberg, PA-C  loratadine (CLARITIN) 10 MG tablet Take 1 tablet (10 mg total) by mouth daily. 07/08/16 01/06/19  Janne Napoleon, NP  ranitidine (ZANTAC) 150 MG capsule Take 1 capsule (150 mg total) by mouth 2 (two) times daily. 12/02/17 01/06/19  Mardella Layman, MD   sucralfate (CARAFATE) 1 g tablet Take 1 tablet (1 g total) by mouth 4 (four) times daily -  with meals and at bedtime. 12/02/17 01/06/19  Mardella Layman, MD    Family History History reviewed. No pertinent family history.  Social History Social History   Tobacco Use  . Smoking status: Current Every Day Smoker  . Smokeless tobacco: Never Used  Substance Use Topics  . Alcohol use: No  . Drug use: Yes    Types: Marijuana     Allergies   Other and Penicillins   Review of Systems Review of Systems   Physical Exam Triage Vital Signs ED Triage Vitals  Enc Vitals Group     BP 04/10/20 1421 122/70     Pulse Rate 04/10/20 1421 91     Resp 04/10/20 1421 18     Temp 04/10/20 1421 98.4 F (36.9 C)     Temp Source 04/10/20 1421 Oral     SpO2 04/10/20 1421 100 %     Weight --      Height --      Head Circumference --      Peak Flow --      Pain Score 04/10/20 1418 0     Pain Loc --      Pain Edu? --      Excl. in GC? --  No data found.  Updated Vital Signs BP 122/70 (BP Location: Left Arm)   Pulse 91   Temp 98.4 F (36.9 C) (Oral)   Resp 18   LMP 03/28/2020 (Approximate)   SpO2 100%    Physical Exam Constitutional:      General: She is not in acute distress.    Appearance: She is well-developed.  Cardiovascular:     Rate and Rhythm: Normal rate.  Pulmonary:     Effort: Pulmonary effort is normal.  Abdominal:     Tenderness: There is abdominal tenderness in the epigastric area.     Comments: Very mild epigastric pain on palpation   Skin:    General: Skin is warm and dry.  Neurological:     Mental Status: She is alert and oriented to person, place, and time.      UC Treatments / Results  Labs (all labs ordered are listed, but only abnormal results are displayed) Labs Reviewed - No data to display  EKG   Radiology No results found.  Procedures Procedures (including critical care time)  Medications Ordered in UC Medications - No data to  display  Initial Impression / Assessment and Plan / UC Course  I have reviewed the triage vital signs and the nursing notes.  Pertinent labs & imaging results that were available during my care of the patient were reviewed by me and considered in my medical decision making (see chart for details).     Non toxic. Benign physical exam. Consistent with gerd. Omeprazole and pepcid provided. Diet recommendations provided and discussed. Patient concerned about gastric ulcer, discussed risk factors and prevention. Return precautions provided. Patient verbalized understanding and agreeable to plan.   Final Clinical Impressions(s) / UC Diagnoses   Final diagnoses:  Gastroesophageal reflux disease, unspecified whether esophagitis present     Discharge Instructions     Omeprazole daily. Pepcid at night as needed.  See food choices which should help your symptoms.  Avoid alcohol, ibuprofen/aleve/ aspirin, and smoking, as able to prevent stomach ulcers.    ED Prescriptions    Medication Sig Dispense Auth. Provider   omeprazole (PRILOSEC) 20 MG capsule Take 1 capsule (20 mg total) by mouth daily. 30 capsule Linus Mako B, NP   famotidine (PEPCID) 20 MG tablet Take 1 tablet (20 mg total) by mouth at bedtime. 30 tablet Georgetta Haber, NP     PDMP not reviewed this encounter.   Georgetta Haber, NP 04/10/20 1500

## 2021-09-15 ENCOUNTER — Ambulatory Visit
Admission: EM | Admit: 2021-09-15 | Discharge: 2021-09-15 | Disposition: A | Discharge status: Home / Self Care | Payer: Medicaid Other | Attending: Family Medicine | Admitting: Family Medicine

## 2021-09-15 ENCOUNTER — Encounter: Payer: Self-pay | Admitting: Emergency Medicine

## 2021-09-15 DIAGNOSIS — R300 Dysuria: Secondary | ICD-10-CM

## 2021-09-15 DIAGNOSIS — N898 Other specified noninflammatory disorders of vagina: Secondary | ICD-10-CM

## 2021-09-15 LAB — POCT URINALYSIS DIP (MANUAL ENTRY)
Glucose, UA: NEGATIVE mg/dL
Nitrite, UA: NEGATIVE
Protein Ur, POC: 100 mg/dL — AB
Spec Grav, UA: >=1.03 — AB (ref 1.010–1.025)
Urobilinogen, UA: 2 E.U./dL — AB
pH, UA: 5.5 (ref 5.0–8.0)

## 2021-09-15 MED ORDER — NITROFURANTOIN MONOHYD MACRO 100 MG PO CAPS: 100.0000 mg | ORAL_CAPSULE | Freq: Two times a day (BID) | ORAL | 0 refills | Status: DC

## 2021-09-15 NOTE — ED Triage Notes (Signed)
Pain on urination since yesterday. 

## 2021-09-15 NOTE — ED Provider Notes (Signed)
[facility]    CSN: [id number] Arrival date & time: 09/15/21  1319      History   Chief Complaint No chief complaint on file.   HPI Erica Bullock is a 26 y.o. female.   Presenting today with 1 day history of dysuria, suprapubic pressure, vaginal odor.  Denies vaginal discharge, lesions, rashes, nausea, vomiting, fevers, back pain.  States there was a bit of blood when wiping this morning but she is also coming off her period so she was uncertain which that was coming from.  Not trying anything over-the-counter for symptoms.    History reviewed. No pertinent past medical history.  There are no problems to display for this patient.   History reviewed. No pertinent surgical history.  OB History   No obstetric history on file.      Home Medications    Prior to Admission medications   Medication Sig Start Date End Date Taking? Authorizing Provider  nitrofurantoin, macrocrystal-monohydrate, (MACROBID) 100 MG capsule Take 1 capsule (100 mg total) by mouth 2 (two) times daily. 09/15/21  Yes Particia Nearing, PA-C  cetirizine (ZYRTEC) 10 MG tablet Take 1 tablet (10 mg total) by mouth daily. 01/06/19   Wallis Bamberg, PA-C  famotidine (PEPCID) 20 MG tablet Take 1 tablet (20 mg total) by mouth at bedtime. 04/10/20   Georgetta Haber, NP  fluticasone (FLONASE) 50 MCG/ACT nasal spray Place 2 sprays into both nostrils daily. 01/06/19   Wallis Bamberg, PA-C  naproxen (NAPROSYN) 500 MG tablet Take 1 tablet (500 mg total) by mouth 2 (two) times daily. 01/06/19   Wallis Bamberg, PA-C  omeprazole (PRILOSEC) 20 MG capsule Take 1 capsule (20 mg total) by mouth daily. 04/10/20   Georgetta Haber, NP  pseudoephedrine (SUDAFED) 60 MG tablet Take 1 tablet (60 mg total) by mouth every 8 (eight) hours as needed for congestion. 01/06/19   Wallis Bamberg, PA-C  loratadine (CLARITIN) 10 MG tablet Take 1 tablet (10 mg total) by mouth daily. 07/08/16 01/06/19  Janne Napoleon, NP  ranitidine (ZANTAC) 150 MG  capsule Take 1 capsule (150 mg total) by mouth 2 (two) times daily. 12/02/17 01/06/19  Mardella Layman, MD  sucralfate (CARAFATE) 1 g tablet Take 1 tablet (1 g total) by mouth 4 (four) times daily -  with meals and at bedtime. 12/02/17 01/06/19  Mardella Layman, MD    Family History History reviewed. No pertinent family history.  Social History Social History   Tobacco Use   Smoking status: Former    Types: Cigarettes   Smokeless tobacco: Never  Vaping Use   Vaping Use: Every day   Substances: Nicotine  Substance Use Topics   Alcohol use: Yes   Drug use: Yes    Types: Marijuana     Allergies   Other and Penicillins   Review of Systems Review of Systems Per HPI  Physical Exam Triage Vital Signs ED Triage Vitals  Enc Vitals Group     BP 09/15/21 1343 120/72     Pulse Rate 09/15/21 1343 65     Resp 09/15/21 1343 18     Temp 09/15/21 1343 98.4 F (36.9 C)     Temp Source 09/15/21 1343 Oral     SpO2 09/15/21 1343 98 %     Weight --      Height --      Head Circumference --      Peak Flow --      Pain Score 09/15/21 1344 5  Pain Loc --      Pain Edu? --      Excl. in GC? --    No data found.  Updated Vital Signs BP 120/72 (BP Location: Right Arm)   Pulse 65   Temp 98.4 F (36.9 C) (Oral)   Resp 18   LMP 09/07/2021 (Exact Date)   SpO2 98%   Visual Acuity Right Eye Distance:   Left Eye Distance:   Bilateral Distance:    Right Eye Near:   Left Eye Near:    Bilateral Near:     Physical Exam Vitals and nursing note reviewed.  Constitutional:      Appearance: Normal appearance. She is not ill-appearing.  HENT:     Head: Atraumatic.     Mouth/Throat:     Mouth: Mucous membranes are moist.     Pharynx: Oropharynx is clear.  Eyes:     Extraocular Movements: Extraocular movements intact.     Conjunctiva/sclera: Conjunctivae normal.  Cardiovascular:     Rate and Rhythm: Normal rate and regular rhythm.     Heart sounds: Normal heart sounds.  Pulmonary:      Effort: Pulmonary effort is normal.     Breath sounds: Normal breath sounds.  Genitourinary:    Comments: GU exam deferred, self swab performed Musculoskeletal:        General: Normal range of motion.     Cervical back: Normal range of motion and neck supple.  Skin:    General: Skin is warm and dry.  Neurological:     Mental Status: She is alert and oriented to person, place, and time.     Motor: No weakness.     Gait: Gait normal.  Psychiatric:        Mood and Affect: Mood normal.        Thought Content: Thought content normal.        Judgment: Judgment normal.    UC Treatments / Results  Labs (all labs ordered are listed, but only abnormal results are displayed) Labs Reviewed  POCT URINALYSIS DIP (MANUAL ENTRY) - Abnormal; Notable for the following components:      Result Value   Bilirubin, UA small (*)    Ketones, POC UA moderate (40) (*)    Spec Grav, UA >=1.030 (*)    Blood, UA moderate (*)    Protein Ur, POC =100 (*)    Urobilinogen, UA 2.0 (*)    Leukocytes, UA Trace (*)    All other components within normal limits  URINE CULTURE  CERVICOVAGINAL ANCILLARY ONLY    EKG   Radiology No results found.  Procedures Procedures (including critical care time)  Medications Ordered in UC Medications - No data to display  Initial Impression / Assessment and Plan / UC Course  I have reviewed the triage vital signs and the nursing notes.  Pertinent labs & imaging results that were available during my care of the patient were reviewed by me and considered in my medical decision making (see chart for details).     Vitals and exam overall reassuring, urinalysis possibly showing urinary tract infection.  We will treat with Macrobid while awaiting urine culture and vaginal swab results for further rule out.  Return for worsening symptoms.  Final Clinical Impressions(s) / UC Diagnoses   Final diagnoses:  Dysuria  Vaginal odor   Discharge Instructions   None     ED Prescriptions     Medication Sig Dispense Auth. Provider   nitrofurantoin, macrocrystal-monohydrate, (MACROBID) 100  MG capsule Take 1 capsule (100 mg total) by mouth 2 (two) times daily. 10 capsule Volney American, [place]      PDMP not reviewed this encounter.   Volney American, [place] 09/15/21 1417

## 2021-09-18 LAB — URINE CULTURE: Culture: 60000 — AB

## 2021-09-20 ENCOUNTER — Telehealth (HOSPITAL_COMMUNITY): Payer: Self-pay | Admitting: Emergency Medicine

## 2021-09-20 LAB — CERVICOVAGINAL ANCILLARY ONLY
Bacterial Vaginitis (gardnerella): POSITIVE — AB
Candida Glabrata: NEGATIVE
Candida Vaginitis: NEGATIVE
Chlamydia: NEGATIVE
Comment: NEGATIVE
Comment: NEGATIVE
Comment: NEGATIVE
Comment: NEGATIVE
Comment: NEGATIVE
Comment: NORMAL
Neisseria Gonorrhea: NEGATIVE
Trichomonas: NEGATIVE

## 2021-09-20 MED ORDER — METRONIDAZOLE 500 MG PO TABS: 500.0000 mg | ORAL_TABLET | Freq: Two times a day (BID) | ORAL | 0 refills | Status: DC

## 2022-03-10 ENCOUNTER — Encounter (HOSPITAL_BASED_OUTPATIENT_CLINIC_OR_DEPARTMENT_OTHER): Payer: Self-pay

## 2022-03-10 ENCOUNTER — Other Ambulatory Visit: Payer: Self-pay

## 2022-03-10 ENCOUNTER — Emergency Department (HOSPITAL_BASED_OUTPATIENT_CLINIC_OR_DEPARTMENT_OTHER)
Admission: EM | Admit: 2022-03-10 | Discharge: 2022-03-11 | Disposition: A | Discharge status: Home / Self Care | Payer: Managed Care, Other (non HMO) | Attending: Emergency Medicine | Admitting: Emergency Medicine

## 2022-03-10 DIAGNOSIS — R1030 Lower abdominal pain, unspecified: Secondary | ICD-10-CM | POA: Diagnosis not present

## 2022-03-10 DIAGNOSIS — R1031 Right lower quadrant pain: Secondary | ICD-10-CM | POA: Diagnosis not present

## 2022-03-10 DIAGNOSIS — N3000 Acute cystitis without hematuria: Secondary | ICD-10-CM | POA: Diagnosis not present

## 2022-03-10 DIAGNOSIS — Z87891 Personal history of nicotine dependence: Secondary | ICD-10-CM | POA: Insufficient documentation

## 2022-03-10 LAB — URINALYSIS, ROUTINE W REFLEX MICROSCOPIC
Glucose, UA: NEGATIVE mg/dL
Ketones, ur: NEGATIVE mg/dL
Nitrite: POSITIVE — AB
Protein, ur: 30 mg/dL — AB
RBC / HPF: >50 RBC/hpf — ABNORMAL HIGH (ref 0–5)
Specific Gravity, Urine: 1.014 (ref 1.005–1.030)
Trans Epithel, UA: <1
WBC, UA: >50 WBC/hpf — ABNORMAL HIGH (ref 0–5)
pH: 5.5 (ref 5.0–8.0)

## 2022-03-10 LAB — PREGNANCY, URINE: Preg Test, Ur: NEGATIVE

## 2022-03-10 MED ORDER — ONDANSETRON HCL 4 MG/2ML IJ SOLN
4.0000 mg | Freq: Once | INTRAMUSCULAR | Status: AC
  Administered 2022-03-11: 4 mg via INTRAVENOUS
  Filled 2022-03-10: qty 2

## 2022-03-10 MED ORDER — HYDROMORPHONE HCL 1 MG/ML IJ SOLN
0.5000 mg | Freq: Once | INTRAMUSCULAR | Status: AC
  Administered 2022-03-11: 0.5 mg via INTRAVENOUS
  Filled 2022-03-10: qty 1

## 2022-03-10 MED ORDER — LACTATED RINGERS IV BOLUS
1000.0000 mL | Freq: Once | INTRAVENOUS | Status: AC
  Administered 2022-03-11: 1000 mL via INTRAVENOUS

## 2022-03-10 NOTE — ED Provider Notes (Signed)
[facility] EMERGENCY [facility] Emergency Department Provider Note MRN:  [id number]  Arrival date & time: 03/11/22     Chief Complaint   Abdominal Pain   History of Present Illness   Erica Bullock is a 27 y.o. year-old female with no pertinent past medical history presenting to the ED with chief complaint of abdominal pain.  Moderate to severe abdominal pain today, feels like she cannot poop.  Has felt like there is a large stool that she cannot pass over the past few hours.  Tries to eat but it comes back up, feels like she has no room.  Also has been having some burning with urination for the past few days.  Review of Systems  A thorough review of systems was obtained and all systems are negative except as noted in the HPI and PMH.   Patient's Health History   History reviewed. No pertinent past medical history.  History reviewed. No pertinent surgical history.  History reviewed. No pertinent family history.  Social History   Socioeconomic History   Marital status: Single    Spouse name: Not on file   Number of children: Not on file   Years of education: Not on file   Highest education level: Not on file  Occupational History   Not on file  Tobacco Use   Smoking status: Former    Types: Cigarettes   Smokeless tobacco: Never  Vaping Use   Vaping Use: Every day   Substances: Nicotine  Substance and Sexual Activity   Alcohol use: Yes    Comment: occasionally   Drug use: Yes    Types: Marijuana   Sexual activity: Not on file  Other Topics Concern   Not on file  Social History Narrative   Not on file   Social Determinants of Health   Financial Resource Strain: Not on file  Food Insecurity: Not on file  Transportation Needs: Not on file  Physical Activity: Not on file  Stress: Not on file  Social Connections: Not on file  Intimate Partner Violence: Not on file     Physical Exam   Vitals:   03/11/22 0304 03/11/22 0657  BP: 116/67 105/68  Pulse: 84 74   Resp: 16 17  Temp: 98.5 F (36.9 C)   SpO2: 99% 99%    CONSTITUTIONAL: Well-appearing, NAD NEURO/PSYCH:  Alert and oriented x 3, no focal deficits EYES:  eyes equal and reactive ENT/NECK:  no LAD, no JVD CARDIO: Regular rate, well-perfused, normal S1 and S2 PULM:  CTAB no wheezing or rhonchi GI/GU:  non-distended, non-tender MSK/SPINE:  No gross deformities, no edema SKIN:  no rash, atraumatic   *Additional and/or pertinent findings included in MDM below  Diagnostic and Interventional Summary    EKG Interpretation  Date/Time:    Ventricular Rate:    PR Interval:    QRS Duration:   QT Interval:    QTC Calculation:   R Axis:     Text Interpretation:         Labs Reviewed  URINALYSIS, ROUTINE W REFLEX MICROSCOPIC - Abnormal; Notable for the following components:      Result Value   Color, Urine ORANGE (*)    APPearance HAZY (*)    Hgb urine dipstick MODERATE (*)    Bilirubin Urine SMALL (*)    Protein, ur 30 (*)    Nitrite POSITIVE (*)    Leukocytes,Ua LARGE (*)    RBC / HPF >50 (*)    WBC, UA >50 (*)  Bacteria, UA RARE (*)    All other components within normal limits  CBC - Abnormal; Notable for the following components:   WBC 14.8 (*)    All other components within normal limits  COMPREHENSIVE METABOLIC PANEL - Abnormal; Notable for the following components:   Potassium 3.1 (*)    AST 14 (*)    All other components within normal limits  LIPASE, BLOOD - Abnormal; Notable for the following components:   Lipase <10 (*)    All other components within normal limits  PREGNANCY, URINE    CT ABDOMEN PELVIS W CONTRAST  Final Result    US Pelvis Complete    (Results Pending)  US Transvaginal Non-OB    (Results Pending)  [place] Art/Ven Flow Abd Pelv Doppler    (Results Pending)    Medications  ondansetron (ZOFRAN) injection 4 mg (4 mg Intravenous Given 03/11/22 0055)  lactated ringers bolus 1,000 mL (0 mLs Intravenous Stopped 03/11/22 0148)  HYDROmorphone  (DILAUDID) injection 0.5 mg (0.5 mg Intravenous Given 03/11/22 0055)  cefTRIAXone (ROCEPHIN) 1 g in sodium chloride 0.9 % 100 mL IVPB (0 g Intravenous Stopped 03/11/22 0145)  iohexol (OMNIPAQUE) 300 MG/ML solution 100 mL (100 mLs Intravenous Contrast Given 03/11/22 0121)  ondansetron (ZOFRAN) injection 4 mg (4 mg Intravenous Given 03/11/22 0154)  HYDROmorphone (DILAUDID) injection 0.5 mg (0.5 mg Intravenous Given 03/11/22 0240)     Procedures  /  Critical Care Ultrasound ED Abd  Date/Time: 03/11/2022 12:03 AM  Performed by: Maudie Flakes, MD Authorized by: Maudie Flakes, MD   Procedure details:    Indications: abdominal pain     Scope of abdominal ultrasound: Urinary retention.   Bladder:  Visualized    Images: archived    Comments:     Normal-appearing bladder, does not appear overly distended.  Suspicious findings for large left adnexal mass.  CT to follow.   ED Course and Medical Decision Making  Initial Impression and Ddx Patient seems uncomfortable, history is suggestive of fecal impaction.  Also with dysuria which could be related to a urinary tract infection.  Will attempt disimpaction and reassess.  12 AM update: With attempted fecal impaction there is no real stool in the rectal vault, however there is a palpable large smooth mass structure palpated anteriorly.  Question distended bladder.  Ultrasound of the bladder described above, size of the bladder appears normal but there is evidence of a large left adnexal mass on ultrasound.  Will obtain CT imaging to further evaluate.  Past medical/surgical history that increases complexity of ED encounter: None  Interpretation of Diagnostics I personally reviewed the laboratory assessment and my interpretation is as follows: No significant blood count or electrolyte disturbance  CT scan is without abnormal findings.  Patient Reassessment and Ultimate Disposition/Management Clinical Course as of 03/11/22 0704  Sun Mar 11, 2022  2426  CT abdomen is overall reassuring.  I discussed my concerns with radiology, who acknowledges that the adnexa does appear a bit abnormal and if there are lingering clinical concerns, ultrasound would be beneficial. [MB]  0237 I suspect the bedside ultrasound findings left of the bladder was actually patient's cervix/uterus, which is left leaning on ultrasound. [MB]  [phone] Patient is endorsing difficulty with bowel movements and pencil thin stools over the past 3 days.  Given this and the palpable mass or structure on rectal exam, another consideration would be some type of intraluminal rectal mass or polyp.  Will touch base with GI in the morning, will  obtain ultrasound in the morning. [MB]    Clinical Course User Index [MB] Sabas Sous, MD     7 AM update: Patient relatively comfortable.  Case discussed with Dr. Orvan Falconer of gastroenterology, based on symptoms patient likely needs colonoscopy.  Question of inpatient versus outpatient should be based on her symptoms.  Will reassess after ultrasound of 8 AM.  Signed out to oncoming provider at shift change.  Patient management required discussion with the following services or consulting groups:  Gastroenterology  Complexity of Problems Addressed Acute illness or injury that poses threat of life of bodily function  Additional Data Reviewed and Analyzed Further history obtained from: None  Additional Factors Impacting ED Encounter Risk Consideration of hospitalization  Elmer Sow. Pilar Plate, MD [facility] Emergency Medicine [facility] [email or web address]  Final Clinical Impressions(s) / ED Diagnoses     ICD-10-CM   1. Acute cystitis without hematuria  N30.00     2. Lower abdominal pain  R10.30       ED Discharge Orders     None        Discharge Instructions Discussed with and Provided to Patient:   Discharge Instructions   None      Sabas Sous, MD 03/11/22 [phone]

## 2022-03-10 NOTE — ED Triage Notes (Signed)
Pt states she thinks she is constipated and has a UTI. She is having right lower quadrant pain. Pt has had intermittent urinary symptoms which has been seen previously for. Pt has had burning in urethral area and burning with urination. Pt has been vomiting today.

## 2022-03-10 NOTE — ED Notes (Signed)
Pt started talking on phone, unable to finish triage.

## 2022-03-10 NOTE — ED Notes (Signed)
Pt called out; states she is very uncomfortable. Pt was crying. Pt calmed

## 2022-03-11 ENCOUNTER — Emergency Department (HOSPITAL_BASED_OUTPATIENT_CLINIC_OR_DEPARTMENT_OTHER): Payer: Managed Care, Other (non HMO)

## 2022-03-11 DIAGNOSIS — R1030 Lower abdominal pain, unspecified: Secondary | ICD-10-CM | POA: Diagnosis not present

## 2022-03-11 LAB — LIPASE, BLOOD: Lipase: <10 U/L — ABNORMAL LOW (ref 11–51)

## 2022-03-11 LAB — COMPREHENSIVE METABOLIC PANEL
ALT: 8 U/L (ref 0–44)
AST: 14 U/L — ABNORMAL LOW (ref 15–41)
Albumin: 4.8 g/dL (ref 3.5–5.0)
Alkaline Phosphatase: 44 U/L (ref 38–126)
Anion gap: 11 (ref 5–15)
BUN: 8 mg/dL (ref 6–20)
CO2: 24 mmol/L (ref 22–32)
Calcium: 9.9 mg/dL (ref 8.9–10.3)
Chloride: 100 mmol/L (ref 98–111)
Creatinine, Ser: 0.76 mg/dL (ref 0.44–1.00)
GFR, Estimated: >60 mL/min (ref >60)
Glucose, Bld: 94 mg/dL (ref 70–99)
Potassium: 3.1 mmol/L — ABNORMAL LOW (ref 3.5–5.1)
Sodium: 135 mmol/L (ref 135–145)
Total Bilirubin: 0.5 mg/dL (ref 0.3–1.2)
Total Protein: 7.6 g/dL (ref 6.5–8.1)

## 2022-03-11 LAB — CBC
HCT: 37.8 % (ref 36.0–46.0)
Hemoglobin: 12.7 g/dL (ref 12.0–15.0)
MCH: 28.9 pg (ref 26.0–34.0)
MCHC: 33.6 g/dL (ref 30.0–36.0)
MCV: 85.9 fL (ref 80.0–100.0)
Platelets: 334 10*3/uL (ref 150–400)
RBC: 4.4 MIL/uL (ref 3.87–5.11)
RDW: 12.7 % (ref 11.5–15.5)
WBC: 14.8 10*3/uL — ABNORMAL HIGH (ref 4.0–10.5)
nRBC: 0 % (ref 0.0–0.2)

## 2022-03-11 MED ORDER — HYDROMORPHONE HCL 1 MG/ML IJ SOLN
0.5000 mg | Freq: Once | INTRAMUSCULAR | Status: AC
  Administered 2022-03-11: 0.5 mg via INTRAVENOUS
  Filled 2022-03-11: qty 1

## 2022-03-11 MED ORDER — OXYCODONE-ACETAMINOPHEN 5-325 MG PO TABS
2.0000 | ORAL_TABLET | Freq: Once | ORAL | Status: AC
  Administered 2022-03-11: 2 via ORAL
  Filled 2022-03-11: qty 2

## 2022-03-11 MED ORDER — SODIUM CHLORIDE 0.9 % IV SOLN
1.0000 g | Freq: Once | INTRAVENOUS | Status: AC
  Administered 2022-03-11: 1 g via INTRAVENOUS
  Filled 2022-03-11: qty 10

## 2022-03-11 MED ORDER — ONDANSETRON HCL 8 MG PO TABS: 8.0000 mg | ORAL_TABLET | Freq: Three times a day (TID) | ORAL | 0 refills | Status: DC | PRN

## 2022-03-11 MED ORDER — ONDANSETRON HCL 4 MG/2ML IJ SOLN
4.0000 mg | Freq: Once | INTRAMUSCULAR | Status: AC
  Administered 2022-03-11: 4 mg via INTRAVENOUS
  Filled 2022-03-11: qty 2

## 2022-03-11 MED ORDER — IOHEXOL 300 MG/ML  SOLN
100.0000 mL | Freq: Once | INTRAMUSCULAR | Status: AC | PRN
  Administered 2022-03-11: 100 mL via INTRAVENOUS

## 2022-03-11 MED ORDER — NITROFURANTOIN MONOHYD MACRO 100 MG PO CAPS: 100.0000 mg | ORAL_CAPSULE | Freq: Two times a day (BID) | ORAL | 0 refills | Status: DC

## 2022-03-11 NOTE — ED Notes (Signed)
Back from US.

## 2022-03-11 NOTE — ED Provider Notes (Signed)
  Physical Exam  BP 104/66   Pulse 66   Temp 98.2 F (36.8 C) (Oral)   Resp 14   Ht 1.549 m (5\' 1" )   Wt 57.2 kg   LMP 02/11/2022 (Exact Date)   SpO2 99%   BMI 23.81 kg/m   Physical Exam  Procedures  Procedures  ED Course / MDM   Clinical Course as of 03/11/22 0921  Nancy Fetter Mar 11, 2022  0237 CT abdomen is overall reassuring.  I discussed my concerns with radiology, who acknowledges that the adnexa does appear a bit abnormal and if there are lingering clinical concerns, ultrasound would be beneficial. [MB]  0237 I suspect the bedside ultrasound findings left of the bladder was actually patient's cervix/uterus, which is left leaning on ultrasound. [MB]  [phone] Patient is endorsing difficulty with bowel movements and pencil thin stools over the past 3 days.  Given this and the palpable mass or structure on rectal exam, another consideration would be some type of intraluminal rectal mass or polyp.  Will touch base with GI in the morning, will obtain ultrasound in the morning. [MB]  0911 Ultrasound personally reviewed and interpreted without MSK abnormality radiologist interpretation notes uterus, endometrium, and ovaries are normal in appearance with small mount of fluid in the pelvis likely physiologic [DR]    Clinical Course User Index [DR] Pattricia Boss, MD [MB] Maudie Flakes, MD   Medical Decision Making Amount and/or Complexity of Data Reviewed Labs: ordered. Radiology: ordered.  Risk Prescription drug management.   27 yo female presented with difficulty stooling.  Mass palpated by Dr. Sedonia Small. ? Adnexal mass vs rectal mass per Dr. Sedonia Small- doubt Korea pending. Patient with urinalysis positive for uti-  Plan discharge if work up negative Reassess after [place]  Reviewed CT and ultrasound right any evidence of mass Patient reevaluated and feels improved from initial symptoms On my reevaluation systolic blood pressure is 101 patient was chatting on the phone is able to carry on a  conversation with me.  She did request some additional pain medicines and will be given in addition oxycodone prior to discharge.  Plan Keflex, Zofran, patient referred to follow-up with GI and advised to call and make appointment on Monday. We discussed return precautions     Pattricia Boss, MD 03/11/22 [phone]

## 2022-03-11 NOTE — Discharge Instructions (Signed)
Please take antibiotics and nausea medicine as prescribed for your urinary tract infection Drink plenty of fluids Call Dr. Payton Emerald office for follow-up appointment for question of rectal mass

## 2022-03-11 NOTE — ED Notes (Signed)
Pt. OOB to BR. Pt. Vomited x 2 after voiding.

## 2022-03-11 NOTE — ED Notes (Addendum)
Delay in d/c and meds d/t critical influx into department. Explained to pt.

## 2022-03-12 ENCOUNTER — Telehealth: Payer: Self-pay

## 2022-03-12 NOTE — Telephone Encounter (Signed)
-----   Message from Thornton Park, MD sent at 03/11/2022  9:23 AM EST ----- Patient needs urgent new patient visit with any doctor or APP. I was called by the ER and asked to arrange for follow-up.  Thanks.  KLB

## 2022-03-12 NOTE — Telephone Encounter (Signed)
Spoke with patient & scheduled OV for 03/19/22 at 1:30 pm with Anderson Malta, PA. She's been provided our address & where to go for her appointment. She is also active on mychart & will receive a reminder there as well.

## 2022-03-19 ENCOUNTER — Ambulatory Visit: Payer: Medicaid Other | Admitting: Physician Assistant

## 2022-03-22 ENCOUNTER — Emergency Department (HOSPITAL_COMMUNITY): Payer: Managed Care, Other (non HMO)

## 2022-03-22 ENCOUNTER — Encounter (HOSPITAL_COMMUNITY): Payer: Self-pay | Admitting: *Deleted

## 2022-03-22 ENCOUNTER — Emergency Department (HOSPITAL_COMMUNITY)
Admission: EM | Admit: 2022-03-22 | Discharge: 2022-03-22 | Disposition: A | Discharge status: Home / Self Care | Payer: Managed Care, Other (non HMO) | Attending: Emergency Medicine | Admitting: Emergency Medicine

## 2022-03-22 ENCOUNTER — Other Ambulatory Visit: Payer: Self-pay

## 2022-03-22 DIAGNOSIS — R079 Chest pain, unspecified: Secondary | ICD-10-CM | POA: Insufficient documentation

## 2022-03-22 DIAGNOSIS — T402X1A Poisoning by other opioids, accidental (unintentional), initial encounter: Secondary | ICD-10-CM | POA: Diagnosis not present

## 2022-03-22 DIAGNOSIS — R7401 Elevation of levels of liver transaminase levels: Secondary | ICD-10-CM | POA: Insufficient documentation

## 2022-03-22 DIAGNOSIS — D72829 Elevated white blood cell count, unspecified: Secondary | ICD-10-CM | POA: Insufficient documentation

## 2022-03-22 DIAGNOSIS — T50904A Poisoning by unspecified drugs, medicaments and biological substances, undetermined, initial encounter: Secondary | ICD-10-CM

## 2022-03-22 DIAGNOSIS — R112 Nausea with vomiting, unspecified: Secondary | ICD-10-CM | POA: Diagnosis present

## 2022-03-22 DIAGNOSIS — Z1152 Encounter for screening for COVID-19: Secondary | ICD-10-CM | POA: Insufficient documentation

## 2022-03-22 DIAGNOSIS — R9431 Abnormal electrocardiogram [ECG] [EKG]: Secondary | ICD-10-CM | POA: Diagnosis not present

## 2022-03-22 HISTORY — DX: Other psychoactive substance abuse, uncomplicated: F19.10

## 2022-03-22 LAB — COMPREHENSIVE METABOLIC PANEL
ALT: 63 U/L — ABNORMAL HIGH (ref 0–44)
AST: 99 U/L — ABNORMAL HIGH (ref 15–41)
Albumin: 4.2 g/dL (ref 3.5–5.0)
Alkaline Phosphatase: 38 U/L (ref 38–126)
Anion gap: 10 (ref 5–15)
BUN: 12 mg/dL (ref 6–20)
CO2: 22 mmol/L (ref 22–32)
Calcium: 8.2 mg/dL — ABNORMAL LOW (ref 8.9–10.3)
Chloride: 106 mmol/L (ref 98–111)
Creatinine, Ser: 0.92 mg/dL (ref 0.44–1.00)
GFR, Estimated: >60 mL/min (ref >60)
Glucose, Bld: 268 mg/dL — ABNORMAL HIGH (ref 70–99)
Potassium: 2.7 mmol/L — CL (ref 3.5–5.1)
Sodium: 138 mmol/L (ref 135–145)
Total Bilirubin: 0.7 mg/dL (ref 0.3–1.2)
Total Protein: 7 g/dL (ref 6.5–8.1)

## 2022-03-22 LAB — CBC WITH DIFFERENTIAL/PLATELET
Abs Immature Granulocytes: 0.13 10*3/uL — ABNORMAL HIGH (ref 0.00–0.07)
Basophils Absolute: 0.1 10*3/uL (ref 0.0–0.1)
Basophils Relative: 0 %
Eosinophils Absolute: 0.2 10*3/uL (ref 0.0–0.5)
Eosinophils Relative: 1 %
HCT: 38.4 % (ref 36.0–46.0)
Hemoglobin: 12.2 g/dL (ref 12.0–15.0)
Immature Granulocytes: 1 %
Lymphocytes Relative: 15 %
Lymphs Abs: 2.7 10*3/uL (ref 0.7–4.0)
MCH: 28.4 pg (ref 26.0–34.0)
MCHC: 31.8 g/dL (ref 30.0–36.0)
MCV: 89.5 fL (ref 80.0–100.0)
Monocytes Absolute: 0.8 10*3/uL (ref 0.1–1.0)
Monocytes Relative: 4 %
Neutro Abs: 14.8 10*3/uL — ABNORMAL HIGH (ref 1.7–7.7)
Neutrophils Relative %: 79 %
Platelets: 354 10*3/uL (ref 150–400)
RBC: 4.29 MIL/uL (ref 3.87–5.11)
RDW: 13.5 % (ref 11.5–15.5)
WBC: 18.7 10*3/uL — ABNORMAL HIGH (ref 4.0–10.5)
nRBC: 0 % (ref 0.0–0.2)

## 2022-03-22 LAB — URINALYSIS, ROUTINE W REFLEX MICROSCOPIC
Bilirubin Urine: NEGATIVE
Glucose, UA: >=500 mg/dL — AB
Hgb urine dipstick: NEGATIVE
Ketones, ur: 20 mg/dL — AB
Leukocytes,Ua: NEGATIVE
Nitrite: NEGATIVE
Protein, ur: 30 mg/dL — AB
Specific Gravity, Urine: 1.024 (ref 1.005–1.030)
pH: 5 (ref 5.0–8.0)

## 2022-03-22 LAB — RAPID URINE DRUG SCREEN, HOSP PERFORMED
Amphetamines: NOT DETECTED
Barbiturates: NOT DETECTED
Benzodiazepines: NOT DETECTED
Cocaine: NOT DETECTED
Opiates: NOT DETECTED
Tetrahydrocannabinol: POSITIVE — AB

## 2022-03-22 LAB — RESP PANEL BY RT-PCR (RSV, FLU A&B, COVID)  RVPGX2
Influenza A by PCR: NEGATIVE
Influenza B by PCR: NEGATIVE
Resp Syncytial Virus by PCR: NEGATIVE
SARS Coronavirus 2 by RT PCR: NEGATIVE

## 2022-03-22 LAB — PREGNANCY, URINE: Preg Test, Ur: NEGATIVE

## 2022-03-22 MED ORDER — DIPHENHYDRAMINE HCL 50 MG/ML IJ SOLN
12.5000 mg | Freq: Once | INTRAMUSCULAR | Status: AC
  Administered 2022-03-22: 12.5 mg via INTRAVENOUS
  Filled 2022-03-22: qty 1

## 2022-03-22 MED ORDER — NALOXONE HCL 2 MG/2ML IJ SOSY
2.0000 mg | PREFILLED_SYRINGE | Freq: Once | INTRAMUSCULAR | Status: AC
  Administered 2022-03-22: 2 mg via INTRAVENOUS
  Filled 2022-03-22: qty 2

## 2022-03-22 MED ORDER — ONDANSETRON HCL 4 MG PO TABS: 4.0000 mg | ORAL_TABLET | ORAL | 0 refills | Status: DC | PRN

## 2022-03-22 MED ORDER — ONDANSETRON HCL 4 MG/2ML IJ SOLN
4.0000 mg | Freq: Once | INTRAMUSCULAR | Status: AC
  Administered 2022-03-22: 4 mg via INTRAVENOUS
  Filled 2022-03-22: qty 2

## 2022-03-22 MED ORDER — POTASSIUM CHLORIDE CRYS ER 20 MEQ PO TBCR
40.0000 meq | EXTENDED_RELEASE_TABLET | Freq: Once | ORAL | Status: AC
  Administered 2022-03-22: 40 meq via ORAL
  Filled 2022-03-22: qty 2

## 2022-03-22 MED ORDER — METOCLOPRAMIDE HCL 5 MG/ML IJ SOLN
5.0000 mg | Freq: Once | INTRAMUSCULAR | Status: AC
  Administered 2022-03-22: 5 mg via INTRAVENOUS
  Filled 2022-03-22: qty 2

## 2022-03-22 MED ORDER — ACETAMINOPHEN 500 MG PO TABS
1000.0000 mg | ORAL_TABLET | Freq: Once | ORAL | Status: AC
  Administered 2022-03-22: 1000 mg via ORAL
  Filled 2022-03-22: qty 2

## 2022-03-22 MED ORDER — SODIUM CHLORIDE 0.9 % IV BOLUS
500.0000 mL | Freq: Once | INTRAVENOUS | Status: AC
  Administered 2022-03-22: 500 mL via INTRAVENOUS

## 2022-03-22 MED ORDER — ONDANSETRON 4 MG PO TBDP: 4.0000 mg | ORAL_TABLET | Freq: Once | ORAL | Status: DC

## 2022-03-22 NOTE — ED Provider Notes (Signed)
  Provider Note MRN:  [id number]  Arrival date & time: 03/22/22    ED Course and Medical Decision Making  Assumed care from [place] at shift change.  See note from prior team for complete details, in brief:  27 year old female here with apparent accidental opiate overdose Which responded appropriately to Narcan but began having nausea, vomiting, possible opiate withdrawal. Given analgesics, IV fluids K Was low, replaced  Plan per prior physician monitor for improvement with nausea and vomiting, possible admission  Patient is feeling much better after intervention and recheck.  She is able to tolerate p.o. intake without much difficulty.  Abdominal pain has improved.  Nausea has improved.  She is ambulatory with steady gait.  Stable discharge.  Given outpatient substance abuse resources.  Not suicidal. Not homicidal, not psychotic.   Apparent accidental overdose  The patient improved significantly and was discharged in stable condition. Detailed discussions were had with the patient regarding current findings, and need for close f/u with PCP or on call doctor. The patient has been instructed to return immediately if the symptoms worsen in any way for re-evaluation. Patient verbalized understanding and is in agreement with current care plan. All questions answered prior to discharge.    Procedures  Final Clinical Impressions(s) / ED Diagnoses     ICD-10-CM   1. Overdose of undetermined intent, initial encounter  B and E       ED Discharge Orders          Ordered    ondansetron (ZOFRAN) 4 MG tablet  Every 4 hours PRN        03/22/22 2246              Discharge Instructions      There is help if you need it.  Please do not use dirty needles, this could cause you a severe infection to your skin, heart or spinal cord.  This could kill you or leave you permanently disabled.  There was a recent study done at [facility] that showed that the risk of death for someone that had  unintentionally overdosed on narcotics was as high as 15% in the next year.  This is much higher than most every other medical condition.  [facility] Solution to the Opioid Problem (GCSTOP) Fixed; mobile; peer-based Peri Maris [phone] [email or web address] Fixed site exchange at [facility], Wednesdays (2-5pm) and Thursdays (4-8pm). [place] [employer], [place] [address] Call or text to arrange mobile and peer exchange, Mondays (1-4pm) and Fridays (4-7pm). [email or web address]  Suboxone clinic: [facility] [address] [place], [place]  [facility] [place], [place] [address] [place]  It was a pleasure caring for you today in the emergency department.  Please return to the emergency department for any worsening or worrisome symptoms.         Jeanell Sparrow, DO 03/22/22 2248

## 2022-03-22 NOTE — ED Triage Notes (Signed)
BIB EMS found on BR floor at someone's house she just met. Finally admitted she "Snorted Something" pt was bagged for 5 min and Narcan 1mg  IN given, pt now alert and oriented. 134/90-100% RA 70 CBG 247

## 2022-03-22 NOTE — ED Notes (Signed)
Pt attempted to ambulate but was unable to do so successfully. She was unable to stand unassisted.

## 2022-03-22 NOTE — Discharge Instructions (Addendum)
There is help if you need it.  Please do not use dirty needles, this could cause you a severe infection to your skin, heart or spinal cord.  This could kill you or leave you permanently disabled.  There was a recent study done at [facility] that showed that the risk of death for someone that had unintentionally overdosed on narcotics was as high as 15% in the next year.  This is much higher than most every other medical condition.  [facility] Solution to the Opioid Problem (GCSTOP) Fixed; mobile; peer-based Peri Maris [phone] [email or web address] Fixed site exchange at [facility], Wednesdays (2-5pm) and Thursdays (4-8pm). [address] [place], [place] [address] Call or text to arrange mobile and peer exchange, Mondays (1-4pm) and Fridays (4-7pm). [place] [email or web address]  Suboxone clinic: [facility] [address] [place], [place]  [facility] [place], [place] [address] [place]  It was a pleasure caring for you today in the emergency department.  Please return to the emergency department for any worsening or worrisome symptoms.

## 2022-03-22 NOTE — ED Provider Notes (Signed)
[facility] DEPT Provider Note   CSN: [id number] Arrival date & time: 03/22/22  1030     History  Chief Complaint  Patient presents with   Drug Overdose    Erica Bullock is a 27 y.o. female no pertinent PMH here for suspected overdose after "snorting something" this morning. Was found down in bedroom of someone she just met and was bagged for 5 minutes and given Narcan 1 mg intranasal by EMS. Patient was alert and oriented afterwards. On my history patient only attests to vaping, unsure if she took cocaine or opioids. She is feeling slightly nauseated. Says she has some midline chest pain that is non-radiating. Denies any pain elsewhere. A&Ox4.     Drug Overdose Associated symptoms include chest pain. Pertinent negatives include no abdominal pain and no shortness of breath.   Drug Overdose Associated symptoms include chest pain. Pertinent negatives include no abdominal pain and no shortness of breath.   Drug Overdose Pertinent negatives include no chest pain, no abdominal pain and no shortness of breath.   Home Medications Prior to Admission medications   Medication Sig Start Date End Date Taking? Authorizing Provider  cetirizine (ZYRTEC) 10 MG tablet Take 1 tablet (10 mg total) by mouth daily. 01/06/19   Jaynee Eagles, PA-C  famotidine (PEPCID) 20 MG tablet Take 1 tablet (20 mg total) by mouth at bedtime. 04/10/20   Zigmund Gottron, NP  fluticasone (FLONASE) 50 MCG/ACT nasal spray Place 2 sprays into both nostrils daily. 01/06/19   Jaynee Eagles, PA-C  metroNIDAZOLE (FLAGYL) 500 MG tablet Take 1 tablet (500 mg total) by mouth 2 (two) times daily. 09/20/21   Lamptey, Myrene Galas, MD  naproxen (NAPROSYN) 500 MG tablet Take 1 tablet (500 mg total) by mouth 2 (two) times daily. 01/06/19   Jaynee Eagles, PA-C  nitrofurantoin, macrocrystal-monohydrate, (MACROBID) 100 MG capsule Take 1 capsule (100 mg total) by mouth 2 (two) times daily. 03/11/22   Pattricia Boss, MD   omeprazole (PRILOSEC) 20 MG capsule Take 1 capsule (20 mg total) by mouth daily. 04/10/20   Zigmund Gottron, NP  ondansetron (ZOFRAN) 8 MG tablet Take 1 tablet (8 mg total) by mouth every 8 (eight) hours as needed for nausea or vomiting. 03/11/22   Pattricia Boss, MD  pseudoephedrine (SUDAFED) 60 MG tablet Take 1 tablet (60 mg total) by mouth every 8 (eight) hours as needed for congestion. 01/06/19   Jaynee Eagles, PA-C  loratadine (CLARITIN) 10 MG tablet Take 1 tablet (10 mg total) by mouth daily. 07/08/16 01/06/19  Ashley Murrain, NP  ranitidine (ZANTAC) 150 MG capsule Take 1 capsule (150 mg total) by mouth 2 (two) times daily. 12/02/17 01/06/19  Vanessa Kick, MD  sucralfate (CARAFATE) 1 g tablet Take 1 tablet (1 g total) by mouth 4 (four) times daily -  with meals and at bedtime. 12/02/17 01/06/19  Vanessa Kick, MD      Allergies    Other and Penicillins    Review of Systems   Review of Systems  Constitutional:  Positive for diaphoresis. Negative for chills and fever.  HENT:  Negative for ear pain and sore throat.   Eyes:  Negative for pain and visual disturbance.  Respiratory:  Negative for cough and shortness of breath.   Cardiovascular:  Positive for chest pain. Negative for palpitations.  Gastrointestinal:  Positive for nausea. Negative for abdominal pain and vomiting.  Genitourinary:  Negative for dysuria and hematuria.  Musculoskeletal:  Negative for arthralgias and back pain.  Skin:  Negative for color change and rash.  Neurological:  Negative for seizures and syncope.  Psychiatric/Behavioral:  Positive for decreased concentration.   All other systems reviewed and are negative.   Physical Exam Updated Vital Signs BP 130/82   Pulse 68   Temp (!) 97.3 F (36.3 C) (Oral)   Resp 10   Ht 5\' 1"  (1.549 m)   Wt 57.2 kg   LMP 02/11/2022 (Exact Date)   SpO2 100%   BMI 23.81 kg/m  Physical Exam Vitals and nursing note reviewed.  Constitutional:      General: She is not in acute  distress.    Appearance: She is well-developed.  HENT:     Head: Normocephalic and atraumatic.     Nose: Nose normal.     Mouth/Throat:     Mouth: Mucous membranes are moist.  Eyes:     Conjunctiva/sclera: Conjunctivae normal.  Cardiovascular:     Rate and Rhythm: Regular rhythm. Bradycardia present.     Pulses: Normal pulses.     Heart sounds: No murmur heard. Pulmonary:     Effort: Pulmonary effort is normal. No respiratory distress.     Breath sounds: Normal breath sounds.  Abdominal:     General: There is no distension.     Palpations: Abdomen is soft.     Tenderness: There is no abdominal tenderness.  Musculoskeletal:        General: No swelling.     Cervical back: Neck supple.  Skin:    General: Skin is warm and dry.     Capillary Refill: Capillary refill takes less than 2 seconds.  Neurological:     General: No focal deficit present.     Mental Status: She is alert and oriented to person, place, and time.     Cranial Nerves: No cranial nerve deficit.     Motor: No weakness.  Psychiatric:        Mood and Affect: Mood normal.     ED Results / Procedures / Treatments   Labs (all labs ordered are listed, but only abnormal results are displayed) Labs Reviewed  CBC WITH DIFFERENTIAL/PLATELET - Abnormal; Notable for the following components:      Result Value   WBC 18.7 (*)    Neutro Abs 14.8 (*)    Abs Immature Granulocytes 0.13 (*)    All other components within normal limits  COMPREHENSIVE METABOLIC PANEL - Abnormal; Notable for the following components:   Potassium 2.7 (*)    Glucose, Bld 268 (*)    Calcium 8.2 (*)    AST 99 (*)    ALT 63 (*)    All other components within normal limits  PREGNANCY, URINE  RAPID URINE DRUG SCREEN, HOSP PERFORMED    EKG EKG Interpretation  Date/Time:  Thursday March 22 2022 11:33:14 EST Ventricular Rate:  67 PR Interval:  110 QRS Duration: 84 QT Interval:  476 QTC Calculation: 502 R Axis:   212 Text  Interpretation: Sinus rhythm with short PR Right superior axis deviation T wave abnormality, consider inferior ischemia Prolonged QT Abnormal ECG No significant change since last tracing Confirmed by 05-08-1991 [phone]) on 03/22/2022 1:58:50 PM  Radiology DG Chest 2 View  Result Date: 03/22/2022 CLINICAL DATA:  Chest discomfort, snorted something, found on floor, received Narcan EXAM: CHEST - 2 VIEW COMPARISON:  None Available. FINDINGS: Upper normal heart size. Mediastinal contours and pulmonary vascularity normal. Lungs clear. No pulmonary infiltrate, pleural effusion, or pneumothorax. Osseous structures unremarkable. IMPRESSION:  No acute abnormalities. Electronically Signed   By: Lavonia Dana M.D.   On: 03/22/2022 11:59    Procedures Procedures   Medications Ordered in ED Medications  ondansetron (ZOFRAN-ODT) disintegrating tablet 4 mg (has no administration in time range)  potassium chloride SA (KLOR-CON M) CR tablet 40 mEq (40 mEq Oral Given 03/22/22 1431)  naloxone (NARCAN) injection 2 mg (2 mg Intravenous Given 03/22/22 1343)  acetaminophen (TYLENOL) tablet 1,000 mg (1,000 mg Oral Given 03/22/22 1431)  ondansetron (ZOFRAN) injection 4 mg (4 mg Intravenous Given 03/22/22 1551)  sodium chloride 0.9 % bolus 500 mL (500 mLs Intravenous New Bag/Given 03/22/22 1550)    ED Course/ Medical Decision Making/ A&P                           Medical Decision Making 27 yo female here for overdose this morning. Received Narcan x 1 in route with improvement. She was alert and oriented here in ED. CBC showed some leukocytosis likely a stress reaction,  CMP showed K of 2.7 with mild transaminitis. Upreg ordered. Some midline chest pain - EKG showed no acute ischemic changes. CXR showed no acute abnormalities.  Patient on reassessment remained lethargic - Narcan 2 mg injection given and patient more awake. She complained of emesis-IV zofran ordered as well as fluids. Patient signe out to oncoming ED  provider.    Final Clinical Impression(s) / ED Diagnoses Final diagnoses:  None    Rx / DC Orders ED Discharge Orders     None         Gerrit Heck, MD 03/22/22 [place], Braxton, DO 03/22/22 1715

## 2022-04-09 ENCOUNTER — Ambulatory Visit: Payer: Medicaid Other | Admitting: Physician Assistant

## 2022-05-26 ENCOUNTER — Ambulatory Visit (HOSPITAL_COMMUNITY)
Admission: EM | Admit: 2022-05-26 | Discharge: 2022-05-26 | Disposition: A | Discharge status: Home / Self Care | Payer: Managed Care, Other (non HMO) | Attending: Physician Assistant | Admitting: Physician Assistant

## 2022-05-26 ENCOUNTER — Encounter (HOSPITAL_COMMUNITY): Payer: Self-pay

## 2022-05-26 DIAGNOSIS — L03011 Cellulitis of right finger: Secondary | ICD-10-CM

## 2022-05-26 MED ORDER — DOXYCYCLINE HYCLATE 100 MG PO CAPS: 100.0000 mg | ORAL_CAPSULE | Freq: Two times a day (BID) | ORAL | 0 refills | Status: AC

## 2022-05-26 NOTE — Discharge Instructions (Signed)
Good to meet you today.  You have an infection of your right middle finger, which we drained in the urgent care today.  Please start on the doxycycline twice daily as directed.  Caution with sun sensitivity and upset stomach with this medication.  Please take with yogurt.  Warm soaks 3 times daily and try to squeeze the rest infection out.  Follow-up here as needed.

## 2022-05-26 NOTE — ED Triage Notes (Signed)
Patient here for right middle finger nail appears infected. She has inflammation and discoloration. Patient noticed it at the beginning of the week. She tried soaking it in warm water which helped some. No injury.

## 2022-05-26 NOTE — ED Provider Notes (Signed)
[place]    CSN: [id number] Arrival date & time: 05/26/22  1005      History   Chief Complaint Chief Complaint  Patient presents with   Nail Problem    HPI Erica Bullock is a 27 y.o. female with right middle finger pain and swelling, worse in the last 2 days.  She is right-hand dominant.  Symptoms started about 1 week ago.  She does wear fake nails.  She has had a finger infection years ago and had to have a digital block, which she states was very painful, but effective.  No fever or chills.  No other symptoms or concerns today.  She has a penicillin allergy.   Past Medical History:  Diagnosis Date   Polysubstance abuse ([place])     There are no problems to display for this patient.   History reviewed. No pertinent surgical history.  OB History   No obstetric history on file.      Home Medications    Prior to Admission medications   Medication Sig Start Date End Date Taking? Authorizing Provider  doxycycline (VIBRAMYCIN) 100 MG capsule Take 1 capsule (100 mg total) by mouth 2 (two) times daily for 7 days. 05/26/22 06/02/22 Yes Mariabella Nilsen M, PA-C  cetirizine (ZYRTEC) 10 MG tablet Take 1 tablet (10 mg total) by mouth daily. Patient not taking: Reported on 03/22/2022 01/06/19   Jaynee Eagles, PA-C  famotidine (PEPCID) 20 MG tablet Take 1 tablet (20 mg total) by mouth at bedtime. Patient not taking: Reported on 03/22/2022 04/10/20   Zigmund Gottron, NP  fluticasone [facility]) 50 MCG/ACT nasal spray Place 2 sprays into both nostrils daily. Patient not taking: Reported on 03/22/2022 01/06/19   Jaynee Eagles, PA-C  metroNIDAZOLE (FLAGYL) 500 MG tablet Take 1 tablet (500 mg total) by mouth 2 (two) times daily. Patient not taking: Reported on 03/22/2022 09/20/21   Chase Picket, MD  naproxen (NAPROSYN) 500 MG tablet Take 1 tablet (500 mg total) by mouth 2 (two) times daily. Patient not taking: Reported on 03/22/2022 01/06/19   Jaynee Eagles, PA-C  nitrofurantoin,  macrocrystal-monohydrate, (MACROBID) 100 MG capsule Take 1 capsule (100 mg total) by mouth 2 (two) times daily. Patient not taking: Reported on 03/22/2022 03/11/22   Pattricia Boss, MD  omeprazole (PRILOSEC) 20 MG capsule Take 1 capsule (20 mg total) by mouth daily. Patient not taking: Reported on 03/22/2022 04/10/20   Augusto Gamble B, NP  ondansetron (ZOFRAN) 4 MG tablet Take 1 tablet (4 mg total) by mouth every 4 (four) hours as needed for nausea or vomiting. 03/22/22   Jeanell Sparrow, DO  pseudoephedrine (SUDAFED) 60 MG tablet Take 1 tablet (60 mg total) by mouth every 8 (eight) hours as needed for congestion. Patient not taking: Reported on 03/22/2022 01/06/19   Jaynee Eagles, PA-C  loratadine (CLARITIN) 10 MG tablet Take 1 tablet (10 mg total) by mouth daily. 07/08/16 01/06/19  Ashley Murrain, NP  ranitidine (ZANTAC) 150 MG capsule Take 1 capsule (150 mg total) by mouth 2 (two) times daily. 12/02/17 01/06/19  Vanessa Kick, MD  sucralfate (CARAFATE) 1 g tablet Take 1 tablet (1 g total) by mouth 4 (four) times daily -  with meals and at bedtime. 12/02/17 01/06/19  Vanessa Kick, MD    Family History History reviewed. No pertinent family history.  Social History Social History   Tobacco Use   Smoking status: Former    Types: Cigarettes   Smokeless tobacco: Never  Media planner  Vaping Use: Every day   Substances: Nicotine  Substance Use Topics   Alcohol use: Yes    Comment: occasionally   Drug use: Yes    Types: Marijuana     Allergies   Other and Penicillins   Review of Systems Review of Systems   Physical Exam Triage Vital Signs ED Triage Vitals  Enc Vitals Group     BP 05/26/22 1035 100/67     Pulse Rate 05/26/22 1035 70     Resp 05/26/22 1035 16     Temp 05/26/22 1035 97.9 F (36.6 C)     Temp Source 05/26/22 1035 Oral     SpO2 05/26/22 1035 99 %     Weight 05/26/22 1035 121 lb (54.9 kg)     Height 05/26/22 1035 5\' 1"  (1.549 m)     Head Circumference --      Peak Flow --       Pain Score 05/26/22 1034 8     Pain Loc --      Pain Edu? --      Excl. in [place]? --    No data found.  Updated Vital Signs BP 100/67 (BP Location: Left Arm)   Pulse 70   Temp 97.9 F (36.6 C) (Oral)   Resp 16   Ht 5\' 1"  (1.549 m)   Wt 121 lb (54.9 kg)   LMP 05/18/2022 (Exact Date)   SpO2 99%   BMI 22.86 kg/m     Physical Exam Vitals and nursing note reviewed.  Constitutional:      Appearance: Normal appearance.  Eyes:     Extraocular Movements: Extraocular movements intact.     Conjunctiva/sclera: Conjunctivae normal.     Pupils: Pupils are equal, round, and reactive to light.  Cardiovascular:     Rate and Rhythm: Normal rate and regular rhythm.  Pulmonary:     Effort: Pulmonary effort is normal.     Breath sounds: Normal breath sounds.  Musculoskeletal:     Comments: Distal end right middle finger is edematous, erythematous, very fluctuant, somewhat tender to palpation.  Does not affect the DIP joint.  She is able to flex and extend as normal.  Her neurovascular function is intact.  Neurological:     Mental Status: She is alert.      UC Treatments / Results  Labs (all labs ordered are listed, but only abnormal results are displayed) Labs Reviewed - No data to display  EKG   Radiology No results found.  Procedures Procedures (including critical care time)  Medications Ordered in UC Medications - No data to display  Initial Impression / Assessment and Plan / UC Course  I have reviewed the triage vital signs and the nursing notes.  Pertinent labs & imaging results that were available during my care of the patient were reviewed by me and considered in my medical decision making (see chart for details).     Patient presented with right middle finger paronychia.  Very fluctuant.  She was agreeable with drainage in office today.  Procedure explained, verbal consent obtained.  Area prepped with sterile alcohol pads.  LET spray applied to the paronychia,  23-gauge needle used to drain the paronychia.  Squeezed purulent discharge from the site.  Patient felt immediately better.  Aftercare instructions provided.  She was given a Band-Aid to apply over the area.  She will start on doxycycline as directed.  She will continue to soak and squeeze her finger over the weekend.  She  will follow-up with [place] as needed.  She will watch for worsening signs of infection including redness, pain, fever.  Final Clinical Impressions(s) / UC Diagnoses   Final diagnoses:  Paronychia of right middle finger     Discharge Instructions      Good to meet you today.  You have an infection of your right middle finger, which we drained in the urgent care today.  Please start on the doxycycline twice daily as directed.  Caution with sun sensitivity and upset stomach with this medication.  Please take with yogurt.  Warm soaks 3 times daily and try to squeeze the rest infection out.  Follow-up here as needed.     ED Prescriptions     Medication Sig Dispense Auth. Provider   doxycycline (VIBRAMYCIN) 100 MG capsule Take 1 capsule (100 mg total) by mouth 2 (two) times daily for 7 days. 14 capsule Lekha Dancer M, PA-C      PDMP not reviewed this encounter.   AllwardtRanda Evens, PA-C 05/26/22 1215

## 2022-08-26 ENCOUNTER — Encounter (HOSPITAL_COMMUNITY): Payer: Self-pay

## 2022-08-26 ENCOUNTER — Emergency Department (HOSPITAL_COMMUNITY)
Admission: EM | Admit: 2022-08-26 | Discharge: 2022-08-27 | Disposition: A | Discharge status: Home / Self Care | Payer: Managed Care, Other (non HMO) | Attending: Emergency Medicine | Admitting: Emergency Medicine

## 2022-08-26 ENCOUNTER — Other Ambulatory Visit: Payer: Self-pay

## 2022-08-26 DIAGNOSIS — S80861A Insect bite (nonvenomous), right lower leg, initial encounter: Secondary | ICD-10-CM

## 2022-08-26 DIAGNOSIS — W57XXXA Bitten or stung by nonvenomous insect and other nonvenomous arthropods, initial encounter: Secondary | ICD-10-CM | POA: Diagnosis not present

## 2022-08-26 LAB — CBC WITH DIFFERENTIAL/PLATELET
Abs Immature Granulocytes: 0.01 10*3/uL (ref 0.00–0.07)
Basophils Absolute: 0 10*3/uL (ref 0.0–0.1)
Basophils Relative: 0 %
Eosinophils Absolute: 0.4 10*3/uL (ref 0.0–0.5)
Eosinophils Relative: 5 %
HCT: 38.4 % (ref 36.0–46.0)
Hemoglobin: 12.4 g/dL (ref 12.0–15.0)
Immature Granulocytes: 0 %
Lymphocytes Relative: 46 %
Lymphs Abs: 3.1 10*3/uL (ref 0.7–4.0)
MCH: 28.5 pg (ref 26.0–34.0)
MCHC: 32.3 g/dL (ref 30.0–36.0)
MCV: 88.3 fL (ref 80.0–100.0)
Monocytes Absolute: 0.4 10*3/uL (ref 0.1–1.0)
Monocytes Relative: 7 %
Neutro Abs: 2.9 10*3/uL (ref 1.7–7.7)
Neutrophils Relative %: 42 %
Platelets: 284 10*3/uL (ref 150–400)
RBC: 4.35 MIL/uL (ref 3.87–5.11)
RDW: 12.8 % (ref 11.5–15.5)
WBC: 6.8 10*3/uL (ref 4.0–10.5)
nRBC: 0 % (ref 0.0–0.2)

## 2022-08-26 LAB — COMPREHENSIVE METABOLIC PANEL
ALT: 13 U/L (ref 0–44)
AST: 21 U/L (ref 15–41)
Albumin: 4.5 g/dL (ref 3.5–5.0)
Alkaline Phosphatase: 48 U/L (ref 38–126)
Anion gap: 8 (ref 5–15)
BUN: 15 mg/dL (ref 6–20)
CO2: 24 mmol/L (ref 22–32)
Calcium: 9.5 mg/dL (ref 8.9–10.3)
Chloride: 102 mmol/L (ref 98–111)
Creatinine, Ser: 0.66 mg/dL (ref 0.44–1.00)
GFR, Estimated: >60 mL/min (ref >60)
Glucose, Bld: 93 mg/dL (ref 70–99)
Potassium: 3.8 mmol/L (ref 3.5–5.1)
Sodium: 134 mmol/L — ABNORMAL LOW (ref 135–145)
Total Bilirubin: 0.4 mg/dL (ref 0.3–1.2)
Total Protein: 7.2 g/dL (ref 6.5–8.1)

## 2022-08-26 LAB — I-STAT BETA HCG BLOOD, ED (MC, WL, AP ONLY): I-stat hCG, quantitative: <5 m[IU]/mL (ref <5)

## 2022-08-26 NOTE — ED Triage Notes (Signed)
Pt woke up two days ago with a red spot on her right lower leg, thought it might be an insect bite. States she woke up this morning with increased redness and swelling, pt also reports when she walks on her leg her calf feels tighter. No significant pain, area is cool to touch, denies fever

## 2022-08-27 NOTE — ED Provider Notes (Signed)
[place] EMERGENCY DEPARTMENT AT [facility] Provider Note   CSN: [id number] Arrival date & time: 08/26/22  1926     History  Chief Complaint  Patient presents with   Insect Bite    Erica Bullock is a 27 y.o. female.  27 year old female presents with concern for insect bite to her right lower leg.  States that she woke up 2 days ago with a small red spot which was raised on her right lower leg, thought it might be a bite.  Patient applied topical insect bite relief for itching, today noticed large area of bruising around the bite.  Notes that she does have a second bite posterior to this.  No other rashes, lesions, bruising, no other complaints or concerns.       Home Medications Prior to Admission medications   Medication Sig Start Date End Date Taking? Authorizing Provider  cetirizine (ZYRTEC) 10 MG tablet Take 1 tablet (10 mg total) by mouth daily. Patient not taking: Reported on 03/22/2022 01/06/19   Wallis Bamberg, PA-C  famotidine (PEPCID) 20 MG tablet Take 1 tablet (20 mg total) by mouth at bedtime. Patient not taking: Reported on 03/22/2022 04/10/20   Georgetta Haber, NP  fluticasone [facility]) 50 MCG/ACT nasal spray Place 2 sprays into both nostrils daily. Patient not taking: Reported on 03/22/2022 01/06/19   Wallis Bamberg, PA-C  metroNIDAZOLE (FLAGYL) 500 MG tablet Take 1 tablet (500 mg total) by mouth 2 (two) times daily. Patient not taking: Reported on 03/22/2022 09/20/21   Merrilee Jansky, MD  naproxen (NAPROSYN) 500 MG tablet Take 1 tablet (500 mg total) by mouth 2 (two) times daily. Patient not taking: Reported on 03/22/2022 01/06/19   Wallis Bamberg, PA-C  nitrofurantoin, macrocrystal-monohydrate, (MACROBID) 100 MG capsule Take 1 capsule (100 mg total) by mouth 2 (two) times daily. Patient not taking: Reported on 03/22/2022 03/11/22   Margarita Grizzle, MD  omeprazole (PRILOSEC) 20 MG capsule Take 1 capsule (20 mg total) by mouth daily. Patient not taking: Reported on  03/22/2022 04/10/20   Linus Mako B, NP  ondansetron (ZOFRAN) 4 MG tablet Take 1 tablet (4 mg total) by mouth every 4 (four) hours as needed for nausea or vomiting. 03/22/22   Sloan Leiter, DO  pseudoephedrine (SUDAFED) 60 MG tablet Take 1 tablet (60 mg total) by mouth every 8 (eight) hours as needed for congestion. Patient not taking: Reported on 03/22/2022 01/06/19   Wallis Bamberg, PA-C  loratadine (CLARITIN) 10 MG tablet Take 1 tablet (10 mg total) by mouth daily. 07/08/16 01/06/19  Janne Napoleon, NP  ranitidine (ZANTAC) 150 MG capsule Take 1 capsule (150 mg total) by mouth 2 (two) times daily. 12/02/17 01/06/19  Mardella Layman, MD  sucralfate (CARAFATE) 1 g tablet Take 1 tablet (1 g total) by mouth 4 (four) times daily -  with meals and at bedtime. 12/02/17 01/06/19  Mardella Layman, MD      Allergies    Other and Penicillins    Review of Systems   Review of Systems Negative except as per HPI Physical Exam Updated Vital Signs BP (!) 146/92 (BP Location: Right Arm)   Pulse 100   Temp 98.3 F (36.8 C) (Oral)   Resp 17   SpO2 100%  Physical Exam Vitals and nursing note reviewed.  Constitutional:      General: She is not in acute distress.    Appearance: She is well-developed. She is not diaphoretic.  HENT:     Head: Normocephalic and  atraumatic.  Pulmonary:     Effort: Pulmonary effort is normal.  Skin:    General: Skin is warm and dry.     Findings: Bruising present.  Neurological:     Mental Status: She is alert and oriented to person, place, and time.  Psychiatric:        Behavior: Behavior normal.        ED Results / Procedures / Treatments   Labs (all labs ordered are listed, but only abnormal results are displayed) Labs Reviewed  COMPREHENSIVE METABOLIC PANEL - Abnormal; Notable for the following components:      Result Value   Sodium 134 (*)    All other components within normal limits  CBC WITH DIFFERENTIAL/PLATELET  I-STAT BETA HCG BLOOD, ED ([facility], [facility], [facility] ONLY)     EKG None  Radiology No results found.  Procedures Procedures    Medications Ordered in ED Medications - No data to display  ED Course/ Medical Decision Making/ A&P                             Medical Decision Making Amount and/or Complexity of Data Reviewed Labs: ordered.   27 year old female with concern for insect bite to the right lower leg.  Reports the area was very itchy, she applied topical itch relief.  Patient was concerned due to bruising surrounding the area and presents to the ER.  Labs were obtained in triage including a CBC which is normal including normal platelets.  hCG is negative.  CMP without significant findings.  Patient is otherwise healthy, no other bruising or spontaneous bleeding concerns.  Area as photographed, likely bruised from itching.  As there are no other areas and her platelets are normal, she can be discharged.  Can apply cortisone cream topically if needed.        Final Clinical Impression(s) / ED Diagnoses Final diagnoses:  Insect bite of right lower leg, initial encounter    Rx / DC Orders ED Discharge Orders     None         Jeannie Fend, PA-C 08/27/22 0014    Melene Plan, DO 08/27/22 1610

## 2022-08-27 NOTE — ED Notes (Signed)
Patient verbalizes understanding of discharge instructions. Opportunity for questioning and answers were provided. Armband removed by staff, pt discharged from ED. Pt ambulatory to ED waiting room with steady gait.  

## 2022-12-26 ENCOUNTER — Encounter (HOSPITAL_COMMUNITY): Payer: Self-pay

## 2022-12-26 ENCOUNTER — Ambulatory Visit (HOSPITAL_COMMUNITY)
Admission: EM | Admit: 2022-12-26 | Discharge: 2022-12-26 | Disposition: A | Discharge status: Home / Self Care | Payer: Medicaid Other | Attending: Emergency Medicine | Admitting: Emergency Medicine

## 2022-12-26 DIAGNOSIS — M25562 Pain in left knee: Secondary | ICD-10-CM | POA: Diagnosis not present

## 2022-12-26 DIAGNOSIS — M79609 Pain in unspecified limb: Secondary | ICD-10-CM | POA: Diagnosis not present

## 2022-12-26 MED ORDER — NAPROXEN 500 MG PO TABS: 500.0000 mg | ORAL_TABLET | Freq: Two times a day (BID) | ORAL | 0 refills | Status: AC

## 2022-12-26 NOTE — ED Notes (Signed)
Specifically reviewed ultrasound appt information/time/location and [facility] follow up.

## 2022-12-26 NOTE — ED Provider Notes (Signed)
[facility]    CSN: [id number] Arrival date & time: 12/26/22  5284      History   Chief Complaint Chief Complaint  Patient presents with   Knee Pain    HPI Erica Bullock is a 27 y.o. female.  Presents with left knee pain that started today  Feels pain with weightbearing Also has discomfort behind the knee No injury, trauma, or fall Has not attempted intervention yet  Reports joint pain in the past. No dx Had right knee xrays in 2015, normal   Past Medical History:  Diagnosis Date   Polysubstance abuse ([facility])     There are no problems to display for this patient.   History reviewed. No pertinent surgical history.  OB History   No obstetric history on file.      Home Medications    Prior to Admission medications   Medication Sig Start Date End Date Taking? Authorizing Provider  naproxen (NAPROSYN) 500 MG tablet Take 1 tablet (500 mg total) by mouth 2 (two) times daily. 12/26/22  Yes Dudley Cooley, Erica Joiner, PA-C  loratadine (CLARITIN) 10 MG tablet Take 1 tablet (10 mg total) by mouth daily. 07/08/16 01/06/19  Janne Napoleon, NP  ranitidine (ZANTAC) 150 MG capsule Take 1 capsule (150 mg total) by mouth 2 (two) times daily. 12/02/17 01/06/19  Mardella Layman, MD  sucralfate (CARAFATE) 1 g tablet Take 1 tablet (1 g total) by mouth 4 (four) times daily -  with meals and at bedtime. 12/02/17 01/06/19  Mardella Layman, MD    Family History History reviewed. No pertinent family history.  Social History Social History   Tobacco Use   Smoking status: Former    Types: Cigarettes   Smokeless tobacco: Never  Vaping Use   Vaping status: Every Day   Substances: Nicotine  Substance Use Topics   Alcohol use: Yes    Comment: occasionally   Drug use: Yes    Types: Marijuana     Allergies   Other and Penicillins   Review of Systems Review of Systems Per HPI  Physical Exam Triage Vital Signs ED Triage Vitals  Encounter Vitals Group     BP 12/26/22 1002 108/72      Systolic BP Percentile --      Diastolic BP Percentile --      Pulse Rate 12/26/22 1002 76     Resp 12/26/22 1002 16     Temp 12/26/22 1002 98.1 F (36.7 C)     Temp Source 12/26/22 1002 Oral     SpO2 12/26/22 1002 98 %     Weight 12/26/22 1001 103 lb (46.7 kg)     Height 12/26/22 1001 5' (1.524 m)     Head Circumference --      Peak Flow --      Pain Score --      Pain Loc --      Pain Education --      Exclude from Growth Chart --    No data found.  Updated Vital Signs BP 108/72 (BP Location: Right Arm)   Pulse 76   Temp 98.1 F (36.7 C) (Oral)   Resp 16   Ht 5' (1.524 m)   Wt 103 lb (46.7 kg)   LMP 11/28/2022 (Exact Date)   SpO2 98%   BMI 20.12 kg/m    Physical Exam Vitals and nursing note reviewed.  Constitutional:      General: She is not in acute distress. HENT:     Mouth/Throat:  Pharynx: Oropharynx is clear.  Cardiovascular:     Rate and Rhythm: Normal rate and regular rhythm.     Pulses: Normal pulses.  Pulmonary:     Effort: Pulmonary effort is normal.  Musculoskeletal:        General: Tenderness present.     Cervical back: Normal range of motion.     Right knee: Normal. No tenderness.     Left knee: No swelling, deformity, effusion or crepitus. Normal range of motion. Tenderness present. Normal pulse.     Comments: Tender lateral left knee. There is pain with palpation of the popliteal space and some in the calf. No cords palpated. No swelling of calf. Good ROM at knee and ankle. Distal sensation intact. Strong DP pulse  Skin:    Capillary Refill: Capillary refill takes less than 2 seconds.  Neurological:     Mental Status: She is alert and oriented to person, place, and time.     UC Treatments / Results  Labs (all labs ordered are listed, but only abnormal results are displayed) Labs Reviewed - No data to display  EKG   Radiology No results found.  Procedures Procedures  Medications Ordered in UC Medications - No data to  display  Initial Impression / Assessment and Plan / UC Course  I have reviewed the triage vital signs and the nursing notes.  Pertinent labs & imaging results that were available during my care of the patient were reviewed by me and considered in my medical decision making (see chart for details).  Left knee pain Shared decision making defer xray today; no injury or trauma, no bony tenderness. Suspect soft tissue etiology. Advised RICE therapy, try naproxen BID and follow up with orthopedics.  Popliteal pain Tender posteriorly, some into the calf. Suspect muscular at this time but will r/o clot with ultrasound. Scheduled for vascular study tomorrow at 11 am No history of DVT and low risk  No questions at this time. Patient agreeable to plan Work note provided   Final Clinical Impressions(s) / UC Diagnoses   Final diagnoses:  Acute pain of left knee  Popliteal pain     Discharge Instructions      Elevate and ice the knee for 20 minutes at a time, several times daily You can try the naproxen twice daily (every 12 hours). Take with food! Do not use other ibuprofen/Advil while taking  Please follow up with orthopedics  I have scheduled you for an ultrasound tomorrow morning at 11 AM. Go to the vein and vascular center (entrance C of the hospital) to have this completed.      ED Prescriptions     Medication Sig Dispense Auth. Provider   naproxen (NAPROSYN) 500 MG tablet Take 1 tablet (500 mg total) by mouth 2 (two) times daily. 30 tablet Erica Bullock, Erica Joiner, PA-C      PDMP not reviewed this encounter.   Erica Bullock, Erica Bullock, [place] 12/26/22 1046

## 2022-12-26 NOTE — ED Triage Notes (Signed)
Patient here today with c/o increased left knee pain that started today. No known injury. Hurts more with weightbearing. Denies taking any medication. Patient states that she has a h/o achy joints.

## 2022-12-26 NOTE — Discharge Instructions (Addendum)
Elevate and ice the knee for 20 minutes at a time, several times daily You can try the naproxen twice daily (every 12 hours). Take with food! Do not use other ibuprofen/Advil while taking  Please follow up with orthopedics  I have scheduled you for an ultrasound tomorrow morning at 11 AM. Go to the vein and vascular center (entrance C of the hospital) to have this completed.

## 2022-12-27 ENCOUNTER — Ambulatory Visit (HOSPITAL_COMMUNITY)
Admission: RE | Admit: 2022-12-27 | Discharge: 2022-12-27 | Disposition: A | Discharge status: Home / Self Care | Payer: Medicaid Other | Source: Ambulatory Visit | Attending: Emergency Medicine | Admitting: Emergency Medicine

## 2022-12-27 DIAGNOSIS — M25569 Pain in unspecified knee: Secondary | ICD-10-CM | POA: Diagnosis not present

## 2022-12-27 DIAGNOSIS — M79605 Pain in left leg: Secondary | ICD-10-CM

## 2022-12-27 NOTE — Progress Notes (Signed)
VASCULAR LAB    Left lower extremity venous duplex has been performed.  See CV proc for preliminary results.   Lakresha Stifter, RVT 12/27/2022, 11:58 AM
